# Patient Record
Sex: Male | Born: 1950 | Race: White | Hispanic: No | Marital: Married
Health system: Southern US, Community
[De-identification: ages and names within clinical notes are randomized; demographics above are authoritative.]

---

## 2000-10-08 ENCOUNTER — Inpatient Hospital Stay (HOSPITAL_COMMUNITY): Admission: EM | Admit: 2000-10-08 | Discharge: 2000-10-15 | Payer: Self-pay | Admitting: Emergency Medicine

## 2000-10-08 ENCOUNTER — Encounter: Payer: Self-pay | Admitting: Emergency Medicine

## 2000-10-09 ENCOUNTER — Encounter: Payer: Self-pay | Admitting: Neurology

## 2000-10-13 ENCOUNTER — Encounter: Payer: Self-pay | Admitting: *Deleted

## 2000-10-15 ENCOUNTER — Encounter: Payer: Self-pay | Admitting: Neurology

## 2000-10-17 ENCOUNTER — Encounter: Admission: RE | Admit: 2000-10-17 | Discharge: 2001-01-15 | Payer: Self-pay | Admitting: Neurology

## 2000-11-12 ENCOUNTER — Inpatient Hospital Stay (HOSPITAL_COMMUNITY): Admission: EM | Admit: 2000-11-12 | Discharge: 2000-11-14 | Payer: Self-pay | Admitting: Emergency Medicine

## 2000-11-12 ENCOUNTER — Encounter: Payer: Self-pay | Admitting: Neurology

## 2000-11-12 ENCOUNTER — Encounter: Payer: Self-pay | Admitting: Emergency Medicine

## 2000-11-13 ENCOUNTER — Encounter: Payer: Self-pay | Admitting: Neurology

## 2000-12-10 ENCOUNTER — Ambulatory Visit (HOSPITAL_COMMUNITY): Admission: RE | Admit: 2000-12-10 | Discharge: 2000-12-10 | Payer: Self-pay | Admitting: Internal Medicine

## 2000-12-10 ENCOUNTER — Encounter: Payer: Self-pay | Admitting: Internal Medicine

## 2000-12-18 ENCOUNTER — Encounter: Payer: Self-pay | Admitting: *Deleted

## 2000-12-18 ENCOUNTER — Inpatient Hospital Stay (HOSPITAL_COMMUNITY): Admission: AD | Admit: 2000-12-18 | Discharge: 2000-12-23 | Payer: Self-pay | Admitting: *Deleted

## 2000-12-22 ENCOUNTER — Encounter: Payer: Self-pay | Admitting: *Deleted

## 2000-12-26 ENCOUNTER — Encounter: Payer: Self-pay | Admitting: Neurology

## 2000-12-26 ENCOUNTER — Encounter: Admission: RE | Admit: 2000-12-26 | Discharge: 2000-12-26 | Payer: Self-pay | Admitting: Neurology

## 2001-01-01 ENCOUNTER — Encounter: Payer: Self-pay | Admitting: Gastroenterology

## 2001-01-01 ENCOUNTER — Encounter: Admission: RE | Admit: 2001-01-01 | Discharge: 2001-01-01 | Payer: Self-pay | Admitting: Gastroenterology

## 2001-01-10 ENCOUNTER — Encounter: Payer: Self-pay | Admitting: Emergency Medicine

## 2001-01-10 ENCOUNTER — Encounter (INDEPENDENT_AMBULATORY_CARE_PROVIDER_SITE_OTHER): Payer: Self-pay | Admitting: Specialist

## 2001-01-11 ENCOUNTER — Encounter: Payer: Self-pay | Admitting: Critical Care Medicine

## 2001-01-11 ENCOUNTER — Inpatient Hospital Stay (HOSPITAL_COMMUNITY): Admission: EM | Admit: 2001-01-11 | Discharge: 2001-01-16 | Payer: Self-pay | Admitting: Emergency Medicine

## 2001-01-14 ENCOUNTER — Encounter: Payer: Self-pay | Admitting: Internal Medicine

## 2001-01-15 ENCOUNTER — Encounter: Payer: Self-pay | Admitting: Internal Medicine

## 2001-01-21 ENCOUNTER — Encounter: Admission: RE | Admit: 2001-01-21 | Discharge: 2001-01-21 | Payer: Self-pay | Admitting: Family Medicine

## 2001-01-30 ENCOUNTER — Ambulatory Visit: Admission: RE | Admit: 2001-01-30 | Discharge: 2001-04-30 | Payer: Self-pay | Admitting: *Deleted

## 2001-02-06 ENCOUNTER — Encounter: Admission: RE | Admit: 2001-02-06 | Discharge: 2001-02-06 | Payer: Self-pay | Admitting: Family Medicine

## 2001-04-28 ENCOUNTER — Encounter: Admission: RE | Admit: 2001-04-28 | Discharge: 2001-04-28 | Payer: Self-pay | Admitting: Family Medicine

## 2001-05-06 ENCOUNTER — Encounter: Admission: RE | Admit: 2001-05-06 | Discharge: 2001-05-06 | Payer: Self-pay | Admitting: Family Medicine

## 2001-06-02 ENCOUNTER — Encounter: Admission: RE | Admit: 2001-06-02 | Discharge: 2001-06-02 | Payer: Self-pay | Admitting: Family Medicine

## 2001-06-11 ENCOUNTER — Encounter: Admission: RE | Admit: 2001-06-11 | Discharge: 2001-06-11 | Payer: Self-pay | Admitting: Family Medicine

## 2001-07-06 ENCOUNTER — Inpatient Hospital Stay (HOSPITAL_COMMUNITY): Admission: EM | Admit: 2001-07-06 | Discharge: 2001-07-07 | Payer: Self-pay | Admitting: Emergency Medicine

## 2001-07-06 ENCOUNTER — Encounter: Payer: Self-pay | Admitting: Family Medicine

## 2001-07-06 ENCOUNTER — Encounter: Admission: RE | Admit: 2001-07-06 | Discharge: 2001-07-06 | Payer: Self-pay | Admitting: Sports Medicine

## 2001-07-06 ENCOUNTER — Encounter: Payer: Self-pay | Admitting: Emergency Medicine

## 2001-07-07 ENCOUNTER — Encounter: Payer: Self-pay | Admitting: Family Medicine

## 2001-07-13 ENCOUNTER — Encounter: Admission: RE | Admit: 2001-07-13 | Discharge: 2001-07-13 | Payer: Self-pay | Admitting: Family Medicine

## 2001-07-20 ENCOUNTER — Encounter: Admission: RE | Admit: 2001-07-20 | Discharge: 2001-07-20 | Payer: Self-pay | Admitting: Sports Medicine

## 2001-09-14 ENCOUNTER — Encounter: Admission: RE | Admit: 2001-09-14 | Discharge: 2001-09-14 | Payer: Self-pay | Admitting: Family Medicine

## 2001-09-16 ENCOUNTER — Ambulatory Visit (HOSPITAL_COMMUNITY): Admission: RE | Admit: 2001-09-16 | Discharge: 2001-09-16 | Payer: Self-pay | Admitting: Gastroenterology

## 2001-09-25 ENCOUNTER — Encounter: Admission: RE | Admit: 2001-09-25 | Discharge: 2001-09-25 | Payer: Self-pay | Admitting: Family Medicine

## 2001-10-15 ENCOUNTER — Encounter: Admission: RE | Admit: 2001-10-15 | Discharge: 2001-10-15 | Payer: Self-pay | Admitting: Family Medicine

## 2001-10-21 ENCOUNTER — Encounter: Admission: RE | Admit: 2001-10-21 | Discharge: 2001-10-21 | Payer: Self-pay | Admitting: Family Medicine

## 2001-12-01 ENCOUNTER — Encounter: Admission: RE | Admit: 2001-12-01 | Discharge: 2001-12-01 | Payer: Self-pay | Admitting: Sports Medicine

## 2001-12-11 ENCOUNTER — Encounter: Admission: RE | Admit: 2001-12-11 | Discharge: 2001-12-11 | Payer: Self-pay | Admitting: Family Medicine

## 2001-12-25 ENCOUNTER — Ambulatory Visit (HOSPITAL_COMMUNITY): Admission: RE | Admit: 2001-12-25 | Discharge: 2001-12-25 | Payer: Self-pay | Admitting: Family Medicine

## 2001-12-25 ENCOUNTER — Encounter: Admission: RE | Admit: 2001-12-25 | Discharge: 2001-12-25 | Payer: Self-pay | Admitting: Family Medicine

## 2002-01-11 ENCOUNTER — Encounter: Payer: Self-pay | Admitting: Emergency Medicine

## 2002-01-11 ENCOUNTER — Inpatient Hospital Stay (HOSPITAL_COMMUNITY): Admission: EM | Admit: 2002-01-11 | Discharge: 2002-01-13 | Payer: Self-pay | Admitting: Emergency Medicine

## 2002-01-14 ENCOUNTER — Encounter: Admission: RE | Admit: 2002-01-14 | Discharge: 2002-01-14 | Payer: Self-pay | Admitting: Family Medicine

## 2002-01-15 ENCOUNTER — Encounter: Admission: RE | Admit: 2002-01-15 | Discharge: 2002-01-15 | Payer: Self-pay | Admitting: Family Medicine

## 2002-01-21 ENCOUNTER — Encounter: Admission: RE | Admit: 2002-01-21 | Discharge: 2002-01-21 | Payer: Self-pay | Admitting: Family Medicine

## 2002-01-22 ENCOUNTER — Ambulatory Visit (HOSPITAL_COMMUNITY): Admission: RE | Admit: 2002-01-22 | Discharge: 2002-01-22 | Payer: Self-pay | Admitting: *Deleted

## 2002-01-27 ENCOUNTER — Encounter: Admission: RE | Admit: 2002-01-27 | Discharge: 2002-01-27 | Payer: Self-pay | Admitting: Family Medicine

## 2002-01-28 ENCOUNTER — Encounter: Payer: Self-pay | Admitting: Family Medicine

## 2002-01-28 ENCOUNTER — Inpatient Hospital Stay (HOSPITAL_COMMUNITY): Admission: EM | Admit: 2002-01-28 | Discharge: 2002-02-01 | Payer: Self-pay | Admitting: Emergency Medicine

## 2002-02-03 ENCOUNTER — Encounter: Admission: RE | Admit: 2002-02-03 | Discharge: 2002-02-03 | Payer: Self-pay | Admitting: Family Medicine

## 2002-02-12 ENCOUNTER — Ambulatory Visit (HOSPITAL_COMMUNITY): Admission: RE | Admit: 2002-02-12 | Discharge: 2002-02-12 | Payer: Self-pay | Admitting: Family Medicine

## 2002-02-12 ENCOUNTER — Encounter: Admission: RE | Admit: 2002-02-12 | Discharge: 2002-02-12 | Payer: Self-pay | Admitting: Family Medicine

## 2002-02-17 ENCOUNTER — Encounter: Admission: RE | Admit: 2002-02-17 | Discharge: 2002-02-17 | Payer: Self-pay | Admitting: Family Medicine

## 2002-03-10 ENCOUNTER — Encounter: Admission: RE | Admit: 2002-03-10 | Discharge: 2002-03-10 | Payer: Self-pay | Admitting: Sports Medicine

## 2002-03-24 ENCOUNTER — Encounter: Admission: RE | Admit: 2002-03-24 | Discharge: 2002-03-24 | Payer: Self-pay | Admitting: Family Medicine

## 2002-04-08 ENCOUNTER — Encounter: Admission: RE | Admit: 2002-04-08 | Discharge: 2002-04-08 | Payer: Self-pay | Admitting: Family Medicine

## 2002-04-15 ENCOUNTER — Encounter: Admission: RE | Admit: 2002-04-15 | Discharge: 2002-04-15 | Payer: Self-pay | Admitting: Family Medicine

## 2002-04-18 ENCOUNTER — Encounter: Payer: Self-pay | Admitting: Emergency Medicine

## 2002-04-18 ENCOUNTER — Encounter: Payer: Self-pay | Admitting: Family Medicine

## 2002-04-18 ENCOUNTER — Inpatient Hospital Stay (HOSPITAL_COMMUNITY): Admission: EM | Admit: 2002-04-18 | Discharge: 2002-04-21 | Payer: Self-pay | Admitting: Emergency Medicine

## 2002-04-19 ENCOUNTER — Encounter: Payer: Self-pay | Admitting: General Surgery

## 2002-04-20 ENCOUNTER — Encounter: Payer: Self-pay | Admitting: General Surgery

## 2002-04-28 ENCOUNTER — Encounter: Admission: RE | Admit: 2002-04-28 | Discharge: 2002-04-28 | Payer: Self-pay | Admitting: Family Medicine

## 2002-05-06 ENCOUNTER — Encounter: Admission: RE | Admit: 2002-05-06 | Discharge: 2002-05-06 | Payer: Self-pay | Admitting: Family Medicine

## 2002-05-12 ENCOUNTER — Encounter: Admission: RE | Admit: 2002-05-12 | Discharge: 2002-05-12 | Payer: Self-pay | Admitting: Family Medicine

## 2002-05-18 ENCOUNTER — Encounter: Admission: RE | Admit: 2002-05-18 | Discharge: 2002-05-18 | Payer: Self-pay | Admitting: Sports Medicine

## 2002-05-27 ENCOUNTER — Encounter: Admission: RE | Admit: 2002-05-27 | Discharge: 2002-05-27 | Payer: Self-pay | Admitting: Family Medicine

## 2002-06-08 ENCOUNTER — Encounter: Admission: RE | Admit: 2002-06-08 | Discharge: 2002-06-08 | Payer: Self-pay | Admitting: Sports Medicine

## 2002-06-11 ENCOUNTER — Encounter: Admission: RE | Admit: 2002-06-11 | Discharge: 2002-06-11 | Payer: Self-pay | Admitting: Family Medicine

## 2002-06-11 ENCOUNTER — Encounter: Admission: RE | Admit: 2002-06-11 | Discharge: 2002-06-11 | Payer: Self-pay | Admitting: Sports Medicine

## 2002-06-11 ENCOUNTER — Encounter: Payer: Self-pay | Admitting: Sports Medicine

## 2002-06-18 ENCOUNTER — Encounter: Admission: RE | Admit: 2002-06-18 | Discharge: 2002-06-18 | Payer: Self-pay | Admitting: Family Medicine

## 2002-06-22 ENCOUNTER — Encounter: Admission: RE | Admit: 2002-06-22 | Discharge: 2002-06-22 | Payer: Self-pay | Admitting: Sports Medicine

## 2002-06-22 ENCOUNTER — Encounter: Admission: RE | Admit: 2002-06-22 | Discharge: 2002-06-22 | Payer: Self-pay | Admitting: Family Medicine

## 2002-06-22 ENCOUNTER — Encounter: Payer: Self-pay | Admitting: Sports Medicine

## 2002-06-25 ENCOUNTER — Encounter: Admission: RE | Admit: 2002-06-25 | Discharge: 2002-06-25 | Payer: Self-pay | Admitting: Family Medicine

## 2002-07-01 ENCOUNTER — Encounter: Admission: RE | Admit: 2002-07-01 | Discharge: 2002-07-01 | Payer: Self-pay | Admitting: Family Medicine

## 2002-07-27 ENCOUNTER — Encounter: Admission: RE | Admit: 2002-07-27 | Discharge: 2002-07-27 | Payer: Self-pay | Admitting: Family Medicine

## 2002-08-12 ENCOUNTER — Encounter: Admission: RE | Admit: 2002-08-12 | Discharge: 2002-08-12 | Payer: Self-pay | Admitting: Family Medicine

## 2002-09-09 ENCOUNTER — Encounter: Admission: RE | Admit: 2002-09-09 | Discharge: 2002-09-09 | Payer: Self-pay | Admitting: Family Medicine

## 2002-09-09 ENCOUNTER — Ambulatory Visit (HOSPITAL_COMMUNITY): Admission: RE | Admit: 2002-09-09 | Discharge: 2002-09-09 | Payer: Self-pay | Admitting: Family Medicine

## 2002-09-10 ENCOUNTER — Encounter: Payer: Self-pay | Admitting: Sports Medicine

## 2002-09-10 ENCOUNTER — Encounter: Admission: RE | Admit: 2002-09-10 | Discharge: 2002-09-10 | Payer: Self-pay | Admitting: Sports Medicine

## 2002-09-24 ENCOUNTER — Encounter: Admission: RE | Admit: 2002-09-24 | Discharge: 2002-09-24 | Payer: Self-pay | Admitting: Family Medicine

## 2002-10-15 ENCOUNTER — Encounter: Admission: RE | Admit: 2002-10-15 | Discharge: 2002-10-15 | Payer: Self-pay | Admitting: Family Medicine

## 2002-10-26 ENCOUNTER — Encounter: Admission: RE | Admit: 2002-10-26 | Discharge: 2002-10-26 | Payer: Self-pay | Admitting: Family Medicine

## 2002-11-18 ENCOUNTER — Encounter: Admission: RE | Admit: 2002-11-18 | Discharge: 2002-11-18 | Payer: Self-pay | Admitting: Sports Medicine

## 2002-11-18 ENCOUNTER — Encounter: Payer: Self-pay | Admitting: Sports Medicine

## 2002-12-07 ENCOUNTER — Encounter: Admission: RE | Admit: 2002-12-07 | Discharge: 2002-12-07 | Payer: Self-pay | Admitting: Family Medicine

## 2002-12-20 ENCOUNTER — Encounter: Admission: RE | Admit: 2002-12-20 | Discharge: 2002-12-20 | Payer: Self-pay | Admitting: Sports Medicine

## 2002-12-20 ENCOUNTER — Encounter: Payer: Self-pay | Admitting: Sports Medicine

## 2002-12-23 ENCOUNTER — Encounter: Admission: RE | Admit: 2002-12-23 | Discharge: 2002-12-23 | Payer: Self-pay | Admitting: Family Medicine

## 2003-01-27 ENCOUNTER — Encounter: Admission: RE | Admit: 2003-01-27 | Discharge: 2003-01-27 | Payer: Self-pay | Admitting: Family Medicine

## 2003-01-31 ENCOUNTER — Encounter: Admission: RE | Admit: 2003-01-31 | Discharge: 2003-01-31 | Payer: Self-pay | Admitting: Sports Medicine

## 2003-01-31 ENCOUNTER — Encounter: Payer: Self-pay | Admitting: Sports Medicine

## 2003-01-31 ENCOUNTER — Encounter: Admission: RE | Admit: 2003-01-31 | Discharge: 2003-01-31 | Payer: Self-pay | Admitting: Family Medicine

## 2003-02-16 ENCOUNTER — Encounter: Admission: RE | Admit: 2003-02-16 | Discharge: 2003-02-16 | Payer: Self-pay | Admitting: Family Medicine

## 2003-02-25 ENCOUNTER — Encounter: Admission: RE | Admit: 2003-02-25 | Discharge: 2003-02-25 | Payer: Self-pay | Admitting: Sports Medicine

## 2003-02-25 ENCOUNTER — Encounter: Payer: Self-pay | Admitting: Sports Medicine

## 2003-03-04 ENCOUNTER — Encounter: Admission: RE | Admit: 2003-03-04 | Discharge: 2003-03-04 | Payer: Self-pay | Admitting: Family Medicine

## 2003-03-29 ENCOUNTER — Encounter: Admission: RE | Admit: 2003-03-29 | Discharge: 2003-03-29 | Payer: Self-pay | Admitting: Sports Medicine

## 2003-04-08 ENCOUNTER — Encounter: Admission: RE | Admit: 2003-04-08 | Discharge: 2003-04-08 | Payer: Self-pay | Admitting: Sports Medicine

## 2003-04-08 ENCOUNTER — Encounter: Payer: Self-pay | Admitting: Sports Medicine

## 2003-04-08 ENCOUNTER — Encounter: Admission: RE | Admit: 2003-04-08 | Discharge: 2003-04-08 | Payer: Self-pay | Admitting: Family Medicine

## 2003-04-12 ENCOUNTER — Encounter: Payer: Self-pay | Admitting: Sports Medicine

## 2003-04-12 ENCOUNTER — Encounter: Admission: RE | Admit: 2003-04-12 | Discharge: 2003-04-12 | Payer: Self-pay | Admitting: Family Medicine

## 2003-04-12 ENCOUNTER — Encounter: Admission: RE | Admit: 2003-04-12 | Discharge: 2003-04-12 | Payer: Self-pay | Admitting: Sports Medicine

## 2003-04-14 ENCOUNTER — Encounter: Admission: RE | Admit: 2003-04-14 | Discharge: 2003-04-14 | Payer: Self-pay | Admitting: Family Medicine

## 2003-05-02 ENCOUNTER — Ambulatory Visit: Admission: RE | Admit: 2003-05-02 | Discharge: 2003-05-02 | Payer: Self-pay | Admitting: Internal Medicine

## 2003-05-04 ENCOUNTER — Encounter: Admission: RE | Admit: 2003-05-04 | Discharge: 2003-05-04 | Payer: Self-pay | Admitting: Family Medicine

## 2003-05-05 ENCOUNTER — Ambulatory Visit (HOSPITAL_COMMUNITY): Admission: RE | Admit: 2003-05-05 | Discharge: 2003-05-06 | Payer: Self-pay | Admitting: *Deleted

## 2003-05-08 ENCOUNTER — Emergency Department (HOSPITAL_COMMUNITY): Admission: EM | Admit: 2003-05-08 | Discharge: 2003-05-08 | Payer: Self-pay

## 2003-06-02 ENCOUNTER — Encounter: Admission: RE | Admit: 2003-06-02 | Discharge: 2003-06-02 | Payer: Self-pay | Admitting: Sports Medicine

## 2003-06-07 ENCOUNTER — Encounter: Payer: Self-pay | Admitting: *Deleted

## 2003-06-07 ENCOUNTER — Ambulatory Visit (HOSPITAL_COMMUNITY): Admission: RE | Admit: 2003-06-07 | Discharge: 2003-06-08 | Payer: Self-pay | Admitting: *Deleted

## 2003-06-09 ENCOUNTER — Inpatient Hospital Stay (HOSPITAL_COMMUNITY): Admission: AD | Admit: 2003-06-09 | Discharge: 2003-06-10 | Payer: Self-pay | Admitting: *Deleted

## 2003-06-10 ENCOUNTER — Encounter: Payer: Self-pay | Admitting: Cardiology

## 2003-07-01 ENCOUNTER — Encounter: Admission: RE | Admit: 2003-07-01 | Discharge: 2003-07-01 | Payer: Self-pay | Admitting: Family Medicine

## 2003-07-22 ENCOUNTER — Encounter: Admission: RE | Admit: 2003-07-22 | Discharge: 2003-07-22 | Payer: Self-pay | Admitting: Family Medicine

## 2003-08-12 ENCOUNTER — Encounter: Admission: RE | Admit: 2003-08-12 | Discharge: 2003-08-12 | Payer: Self-pay | Admitting: Family Medicine

## 2003-08-19 ENCOUNTER — Encounter: Admission: RE | Admit: 2003-08-19 | Discharge: 2003-08-19 | Payer: Self-pay | Admitting: Sports Medicine

## 2003-09-21 ENCOUNTER — Encounter: Admission: RE | Admit: 2003-09-21 | Discharge: 2003-09-21 | Payer: Self-pay | Admitting: Family Medicine

## 2003-10-21 ENCOUNTER — Encounter: Admission: RE | Admit: 2003-10-21 | Discharge: 2003-10-21 | Payer: Self-pay | Admitting: Family Medicine

## 2003-10-21 ENCOUNTER — Encounter: Admission: RE | Admit: 2003-10-21 | Discharge: 2003-10-21 | Payer: Self-pay | Admitting: Sports Medicine

## 2003-11-09 ENCOUNTER — Encounter: Admission: RE | Admit: 2003-11-09 | Discharge: 2003-11-09 | Payer: Self-pay | Admitting: Family Medicine

## 2003-11-17 ENCOUNTER — Encounter: Admission: RE | Admit: 2003-11-17 | Discharge: 2003-11-17 | Payer: Self-pay | Admitting: Family Medicine

## 2003-12-05 ENCOUNTER — Encounter: Admission: RE | Admit: 2003-12-05 | Discharge: 2003-12-05 | Payer: Self-pay | Admitting: Family Medicine

## 2004-01-04 ENCOUNTER — Encounter: Admission: RE | Admit: 2004-01-04 | Discharge: 2004-01-04 | Payer: Self-pay | Admitting: Family Medicine

## 2004-01-16 ENCOUNTER — Encounter: Admission: RE | Admit: 2004-01-16 | Discharge: 2004-01-16 | Payer: Self-pay | Admitting: Sports Medicine

## 2004-01-20 ENCOUNTER — Encounter: Admission: RE | Admit: 2004-01-20 | Discharge: 2004-01-20 | Payer: Self-pay | Admitting: Sports Medicine

## 2004-02-02 ENCOUNTER — Encounter: Admission: RE | Admit: 2004-02-02 | Discharge: 2004-02-02 | Payer: Self-pay | Admitting: Family Medicine

## 2004-02-02 ENCOUNTER — Encounter: Admission: RE | Admit: 2004-02-02 | Discharge: 2004-02-02 | Payer: Self-pay | Admitting: Sports Medicine

## 2004-02-15 ENCOUNTER — Encounter: Admission: RE | Admit: 2004-02-15 | Discharge: 2004-02-15 | Payer: Self-pay | Admitting: Sports Medicine

## 2004-02-27 ENCOUNTER — Encounter: Admission: RE | Admit: 2004-02-27 | Discharge: 2004-02-27 | Payer: Self-pay | Admitting: Sports Medicine

## 2004-03-29 ENCOUNTER — Encounter: Admission: RE | Admit: 2004-03-29 | Discharge: 2004-03-29 | Payer: Self-pay | Admitting: Family Medicine

## 2004-04-30 ENCOUNTER — Encounter: Admission: RE | Admit: 2004-04-30 | Discharge: 2004-04-30 | Payer: Self-pay | Admitting: Sports Medicine

## 2004-05-24 ENCOUNTER — Ambulatory Visit: Payer: Self-pay | Admitting: Family Medicine

## 2004-06-08 ENCOUNTER — Ambulatory Visit: Payer: Self-pay | Admitting: Sports Medicine

## 2004-07-06 ENCOUNTER — Ambulatory Visit: Payer: Self-pay | Admitting: Family Medicine

## 2004-08-08 ENCOUNTER — Ambulatory Visit: Payer: Self-pay | Admitting: Family Medicine

## 2005-01-25 ENCOUNTER — Ambulatory Visit: Payer: Self-pay | Admitting: Cardiology

## 2005-01-31 ENCOUNTER — Ambulatory Visit: Payer: Self-pay

## 2005-02-04 ENCOUNTER — Ambulatory Visit: Payer: Self-pay | Admitting: Internal Medicine

## 2005-02-06 ENCOUNTER — Ambulatory Visit: Payer: Self-pay | Admitting: Cardiology

## 2005-02-07 ENCOUNTER — Ambulatory Visit: Payer: Self-pay | Admitting: Cardiology

## 2005-02-07 ENCOUNTER — Ambulatory Visit (HOSPITAL_COMMUNITY): Admission: RE | Admit: 2005-02-07 | Discharge: 2005-02-07 | Payer: Self-pay | Admitting: Cardiology

## 2005-02-20 ENCOUNTER — Ambulatory Visit: Payer: Self-pay | Admitting: *Deleted

## 2005-03-18 ENCOUNTER — Ambulatory Visit: Payer: Self-pay | Admitting: Neurology

## 2005-03-25 ENCOUNTER — Ambulatory Visit: Payer: Self-pay | Admitting: Internal Medicine

## 2005-04-01 ENCOUNTER — Ambulatory Visit: Payer: Self-pay | Admitting: Internal Medicine

## 2005-11-21 ENCOUNTER — Ambulatory Visit: Payer: Self-pay | Admitting: Cardiology

## 2005-11-23 ENCOUNTER — Ambulatory Visit: Payer: Self-pay | Admitting: Physical Medicine & Rehabilitation

## 2005-11-23 ENCOUNTER — Ambulatory Visit: Payer: Self-pay | Admitting: Cardiovascular Disease

## 2005-11-23 ENCOUNTER — Inpatient Hospital Stay (HOSPITAL_COMMUNITY): Admission: EM | Admit: 2005-11-23 | Discharge: 2005-12-10 | Payer: Self-pay | Admitting: Emergency Medicine

## 2005-11-26 ENCOUNTER — Ambulatory Visit: Payer: Self-pay | Admitting: Emergency Medicine

## 2005-12-10 ENCOUNTER — Inpatient Hospital Stay (HOSPITAL_COMMUNITY)
Admission: RE | Admit: 2005-12-10 | Discharge: 2005-12-20 | Payer: Self-pay | Admitting: Physical Medicine & Rehabilitation

## 2005-12-10 ENCOUNTER — Ambulatory Visit: Payer: Self-pay | Admitting: Physical Medicine & Rehabilitation

## 2006-04-21 ENCOUNTER — Inpatient Hospital Stay (HOSPITAL_COMMUNITY): Admission: EM | Admit: 2006-04-21 | Discharge: 2006-04-25 | Payer: Self-pay | Admitting: Emergency Medicine

## 2006-04-22 ENCOUNTER — Ambulatory Visit: Payer: Self-pay | Admitting: Internal Medicine

## 2006-04-22 ENCOUNTER — Encounter: Payer: Self-pay | Admitting: Internal Medicine

## 2006-04-22 ENCOUNTER — Encounter (INDEPENDENT_AMBULATORY_CARE_PROVIDER_SITE_OTHER): Payer: Self-pay | Admitting: Neurology

## 2006-10-30 DIAGNOSIS — I6789 Other cerebrovascular disease: Secondary | ICD-10-CM | POA: Insufficient documentation

## 2006-10-30 DIAGNOSIS — I739 Peripheral vascular disease, unspecified: Secondary | ICD-10-CM

## 2006-10-30 DIAGNOSIS — J4489 Other specified chronic obstructive pulmonary disease: Secondary | ICD-10-CM | POA: Insufficient documentation

## 2006-10-30 DIAGNOSIS — F339 Major depressive disorder, recurrent, unspecified: Secondary | ICD-10-CM | POA: Insufficient documentation

## 2006-10-30 DIAGNOSIS — Z789 Other specified health status: Secondary | ICD-10-CM | POA: Insufficient documentation

## 2006-10-30 DIAGNOSIS — I209 Angina pectoris, unspecified: Secondary | ICD-10-CM

## 2006-10-30 DIAGNOSIS — I2510 Atherosclerotic heart disease of native coronary artery without angina pectoris: Secondary | ICD-10-CM | POA: Insufficient documentation

## 2006-10-30 DIAGNOSIS — J449 Chronic obstructive pulmonary disease, unspecified: Secondary | ICD-10-CM

## 2006-10-30 DIAGNOSIS — E785 Hyperlipidemia, unspecified: Secondary | ICD-10-CM

## 2006-10-30 DIAGNOSIS — J309 Allergic rhinitis, unspecified: Secondary | ICD-10-CM | POA: Insufficient documentation

## 2006-10-30 DIAGNOSIS — F411 Generalized anxiety disorder: Secondary | ICD-10-CM | POA: Insufficient documentation

## 2006-10-30 DIAGNOSIS — I10 Essential (primary) hypertension: Secondary | ICD-10-CM | POA: Insufficient documentation

## 2006-10-30 DIAGNOSIS — E039 Hypothyroidism, unspecified: Secondary | ICD-10-CM | POA: Insufficient documentation

## 2006-10-30 DIAGNOSIS — K449 Diaphragmatic hernia without obstruction or gangrene: Secondary | ICD-10-CM | POA: Insufficient documentation

## 2006-10-30 DIAGNOSIS — F17200 Nicotine dependence, unspecified, uncomplicated: Secondary | ICD-10-CM

## 2009-02-05 ENCOUNTER — Ambulatory Visit: Payer: Self-pay | Admitting: Family Medicine

## 2009-02-05 DIAGNOSIS — S8010XA Contusion of unspecified lower leg, initial encounter: Secondary | ICD-10-CM | POA: Insufficient documentation

## 2009-08-16 ENCOUNTER — Encounter (INDEPENDENT_AMBULATORY_CARE_PROVIDER_SITE_OTHER): Payer: Self-pay | Admitting: *Deleted

## 2010-04-18 ENCOUNTER — Telehealth: Payer: Self-pay | Admitting: Gastroenterology

## 2010-10-04 NOTE — Progress Notes (Signed)
Summary: Schedule Colonoscopy  Phone Note Outgoing Call Call back at Home Phone [phone]   Call placed by: Harlow Mares CMA Duncan Dull),  April 18, 2010 11:01 AM Call placed to: Patient Summary of Call: spoke to a male and she states that the number we have is not the patients number anymore, we will mail the patient another letter to remind him he is due for a colonoscopy Initial call taken by: Harlow Mares CMA Duncan Dull),  April 18, 2010 11:09 AM

## 2011-01-18 NOTE — H&P (Signed)
NAMESOCRATES, CAHOON              ACCOUNT NO.:  [id number]   MEDICAL RECORD NO.:  [id number]          PATIENT TYPE:  IPS   LOCATION:  4003                         FACILITY:  [facility]   PHYSICIAN:  Erick Colace, M.D.DATE OF BIRTH:  03/18/1951   DATE OF ADMISSION:  12/10/2005  DATE OF DISCHARGE:                                HISTORY & PHYSICAL   REASON FOR ADMISSION:  Gait disorder.   HISTORY:  This 59 year old male has a history of coronary artery disease,  hypertension, prior CVA with short-term memory deficits.  He was admitted on  November 23, 2005 for worsening of chest pain and shortness of breath.  He was  evaluated by [facility] and started on IV heparin.  Cardiac enzymes  were checked and were negative.  CT of the chest no PE, but severe  emphysema.  Dr. Orlin Hilding consulted for input on staring spells question TIA  versus seizure activity.  MRI and MRA done no acute changes, 50% left  vertebral stenosis with reconstitution, no significant changes.  The patient  had hemoptysis and sinusitis per MRI.  Pulmonary consult recommended  __________ for 14 days, taper bilateral carotid with cerebroangio done  occluded left vertebral with mild reconstitution plaque.  Proximal left  subclavian artery need to basilar artery secondary to plaque.  The patient  with organic gait disorder.  MRI of C-spine showed stenosis of C5-6,  herniated nucleus pulposus C6-7, mild flattening of the right side of cord.  Patient with left-sided numbness.  Neurontin dose is increased for  neuropathic pain.  An EEG on 03/21 is normal.  Review of systems is positive  for cough, sputum, dyspnea on exertion, chest pain although none current.  Reflux, numbness, anxiety, depression, and constipation.   PAST HISTORY:  1.  Coronary artery disease.  2.  Esophagitis.  3.  Hypertension.  4.  Depression,  5.  Hypothyroid.  6.  Conversion disorder.  7.  Right CVA.  8.  Emphysema.   FAMILY HISTORY:   Positive cancer.   SOCIAL HISTORY:  Married, lives in 1-level home, 2 steps to entry, disabled,  but independent prior to admission.  Wife works 3 times per week.  Tobacco:  1 pack per day.  No EtOH.   FUNCTIONAL HISTORY:  Been driving prior to admission.   ALLERGIES:  PENICILLIN and SULFA.   PHYSICAL EXAMINATION:  GENERAL:  In no acute distress.  VITAL SIGNS:  Normocephalic, atraumatic.  Eyes not injected.  ENT normal.  NECK:  Supple without adenopathy.  RESPIRATORY:  Good.  Lungs clear.  HEART:  Regular rate and rhythm.  ABDOMEN:  Positive bowel sounds.  Soft and nontender.  EXTREMITIES:  No clubbing, cyanosis, or edema.  NEUROLOGIC:  Sensation is reduced on the left side, but he is able to  distinguish light touch to fingers and toes, but takes a longer time to give  an answer on this than on the right side.  Orientation x3.  Memory:  Short-  term is reduced; mood and affect are appropriate.  Motor strength is 4/5 in  the left deltoid, biceps, triceps, finger flexor,  and hip flexor.  Tibialis  and gastrocnemius 5/5; 5 minus/5 on the right.   IMPRESSION:  1.  Multifocal gait disorder.  History of CVA, neuropathy plus/minus      cervical spinal stenosis.  2.  Hemoptysis, resolved after Avelox.  3.  Depression and anxiety on Trazodone, Wellbutrin, and Cymbalta. Resume      Klonopin.  4.  __________  5.  Coronary artery disease stable on Imdur.  6.  Cerebrovascular accident.  7.  History of mild hemiparesis.   ESTIMATED LENGTH OF STAY:  7-10 days.   Prognosis for functional improvement is fair.      Erick Colace, M.D.  Electronically Signed     AEK/MEDQ  D:  12/10/2005  T:  12/10/2005  Job:  [id number]   cc:   Dr. Carin Hock, M.D.  [address]  [address]  [place]  [place] [address]   Gustavus Messing. Orlin Hilding, M.D.  Fax: [phone]

## 2011-01-18 NOTE — Consult Note (Signed)
NAMEETHEL, VERONICA                          ACCOUNT NO.:  [id number]   MEDICAL RECORD NO.:  [id number]                   PATIENT TYPE:  INP   LOCATION:  5738                                 FACILITY:  [facility]   PHYSICIAN:  Marta Lamas. Gae Bon, M.D.            DATE OF BIRTH:  [birth year]   DATE OF CONSULTATION:  04/18/2002  DATE OF DISCHARGE:                           GENERAL SURGERY CONSULTATION   HISTORY:  Thank you very much for asking me to see this 60 year old  gentleman with significant chronic obstructive pulmonary disease status post  bullectomy at [facility] in August 2002.  He now comes in with a day of feculent  vomiting and severe abdominal pain.   I did not see the patient in the ER where he had most of his pain.  He  received, from what I could tell, 1 mg of Dilaudid in the ED and 2 mg of  morphine on the floor with significant relief of his pain along with  placement of an NG tube.  He has had a lot of feculent vomiting apparently.  Currently there are minimal amounts in his NG tube.  He has had no previous  abdominal surgery although he has multiple operations on his chest for his  lungs.   The patient apparently had a normal colonoscopy in January 2003.  Apparently  had a normal upper GI endoscopy also.  His current pain is of unknown  etiology but currently he started receiving minimal pain medicine.  He has  no peritonitis.  He says he has 1/10 pain with no rebound or guarding.  He  actually has pretty normal active bowel sounds.   Usually being notable for small bowel obstructions in an otherwise healthy  male secondary to __________  or mechanical process that requires  laparotomy, however, to do a laparotomy, a negative laparotomy in a  gentleman with his significant lung disease could have desire consequences.  Therefore, I will do other procedures to rule out mechanical obstruction  meaning likely a Gastrografin enema tomorrow followed by a small bowel  series or  small bowel followthrough the following 24-48 hours.  If there are  no blockages noted or if the patient clinically improves significantly then  he may not require laparotomy.  However, if he should worsen with his  discomfort this evening without getting much pain medication then we will  have to proceed to exploratory laparotomy tonight and his family understands  the risks and benefits of that.                                               Marta Lamas. Gae Bon, M.D.    JOW/MEDQ  D:  04/18/2002  T:  04/21/2002  Job:  [id number]   cc:   Leighton Roach McDiarmid, M.D.

## 2011-01-18 NOTE — H&P (Signed)
[facility]  Patient:    Carlos [name], Carlos [name]                     MRN: [id number] Adm. Date:  [id number] Attending:  Tobey Bride CC:         Lacretia Leigh. Quintella Reichert, M.D.   History and Physical  HISTORY OF PRESENT ILLNESS:  Nikos Anglemyer is a 60 year old right-handed white male, born 1951-06-29, with a history of tobacco abuse and probably an unrecognized or untreated hypertension.  This patient has felt bad for a couple of days, with onset of some left-sided numbness sensation and heaviness in the left leg that developed two days ago.  Patient notes that the numbness involves the left arm, leg and left face.  Patient has had some questionable slurred speech, has had headache in the vertex of the head, denies visual fields changes and has had problems with balance, staggery problems that worsened this morning.  This patient has had no loss of consciousness or confusion.  Patient has no prior history of stroke.  A CT scan of the head done through the emergency room shows bilateral old lacunar-type strokes.  No acute process is seen.  Neurology is asked to see the patient for further evaluation.  PAST MEDICAL HISTORY 1. New-onset of left hemisensory and hemiataxia -- probable stroke event. 2. History of tobacco abuse. 3. History of hypertension, not treated. 4. History of depression. 5. History of tonsillectomy in the past.  MEDICATIONS 1. Paxil 20 mg a day. 3. Restoril 20 mg q.h.s.  SOCIAL HISTORY:  Patient has been smoking a pack and a half of cigarettes daily.  Does not drink alcohol.  Patient lives in [place] area, is married, works transporting cars and has four children who are alive and well.  ALLERGIES:  Has an allergy to PENICILLIN.  FAMILY MEDICAL HISTORY:  Notable in that mother died at age 63 with renal failure and tobacco abuse; father died with a brain tumor and cancer.  He has one sister who is alive and well.  REVIEW OF  SYSTEMS:  Notable for no fever or chills.  Patient does have some neck pain.  Denies problems swallowing.  Does note some recent shortness of breath.  Has had some chest pressure sensations.  Does note occasional cough. Denies any problems controlling the bowels and bladder or dizziness episodes.  PHYSICAL EXAMINATION  VITAL SIGNS:  Blood pressure is 205/96.  Heart rate is 72.  Respiratory rate 20.  Temperature:  Afebrile.  GENERAL:  This patient is a fairly well-developed white male who is alert and cooperative at the time of examination.  HEENT:  Head is atraumatic.  Eyes:  Pupils are equal, round and reactive to light.  Disks are flat bilaterally.  NECK:  Supple.  No carotid bruits are noted.  RESPIRATORY:  Clear to auscultation and percussion.  CARDIOVASCULAR:  Regular rate and rhythm.  No obvious murmurs or rubs noted.  ABDOMEN:  Positive bowel sounds.  No organomegaly or tenderness noted.  EXTREMITIES:  Without significant edema.  NEUROLOGIC:  Cranial nerves as above.  Facial symmetry is present.  Patient notes good sensation to the face with pinprick and soft touch bilaterally. Patient has good strength of facial muscles and the muscles of head turning and shoulder shrugs bilaterally.  Patient has well-enunciated speech, not aphasic or dysarthric.  Again, visual fields are full.  Motor testing reveals trace weakness of the intrinsic muscles of the left hand;  otherwise, strength appears to be full throughout.  Patient has fairly good pinprick sensation in all fours.  Vibratory sensation is symmetric and normal in all fours.  Patient has fair finger-to-nose-to-finger in both upper extremities.  He has 1+ ataxia with toe-to-finger of the left leg as compared to the right.  Deep tendon reflexes are depressed but symmetric.  Toes are neutral bilaterally.  Patient was not ambulated.  IMAGING STUDY:  CT scan of the head does show old bilateral lacunar-type strokes in the left  parietal white matter and right putamen.  LABORATORY VALUES:  Notable for a CPK of 64, MB fraction 0.9, troponin I of less than 0.01; sodium of 142, potassium 4.6, chloride of 108, CO2 28, glucose of 98, BUN of 8, creatinine 1.1, calcium 9.6, total protein 7.8, albumin 4.0, AST of 13, ALT of 10, alkaline phosphatase of 70, bilirubin of 0.4; WBC of 8.6, hemoglobin of 15.8, hematocrit of 45.1, MCV of 82.8, platelets of 319,000.  IMPRESSION 1. New onset of right brain stroke with a left hemisensory deficit and    hemiataxia. 2. History of hypertension. 3. Tobacco abuse.  The patient clearly has some very minimal deficits on todays examination, but suspect the patient has had a recent stroke event.  Patient has had no prior stroke workup.  Will admit this patient briefly for workup.  PLAN 1. Admission to [facility]. 2. MRI scan of the brain. 3. MR angiogram. 4. Two-dimensional echocardiogram. 5. Carotid Dopplers studies. 6. IV fluids.  No heparin. 7. Aspirin therapy. 8. Physical therapy for gait. 9. Will follow clinical course while in house. DD:  10/08/00 TD:  10/09/00 Job: [id number]

## 2011-01-18 NOTE — Consult Note (Signed)
NAMEJAMARQUES, PINEDO              ACCOUNT NO.:  [id number]   MEDICAL RECORD NO.:  [id number]          PATIENT TYPE:  INP   LOCATION:  2038                         FACILITY:  [facility]   PHYSICIAN:  Gustavus Messing. Orlin Hilding, M.D.DATE OF BIRTH:  31-Oct-1950   DATE OF CONSULTATION:  11/25/2005  DATE OF DISCHARGE:                                   CONSULTATION   CHIEF COMPLAINT:  Staring spell. Rule out TIA.   HISTORY OF PRESENT ILLNESS:  Mr. Tones is a 60 year old right-handed  married white man with a history of hypertension, coronary artery disease,  status post stent placement remotely, CHF, lung cancer, status post lung  resection on the right in 2002 with history of two previous major strokes  between 2000 and 2003 with right brain affecting the left side of the body.  He was admitted on November 23, 2005, for chest pain and shortness of breath.  He was supposed to have a catheterization today.  However, yesterday while  family members were visiting he was noted to have an episode of staring,  looked like he was staring at the floor and out the door to his left. He was  unresponsive to verbal interaction for five to ten minutes. The family said  there might have been some very mild left facial drooping following this  episode for about an hour. He was confused and had slurred speech. He did  not really recall anything about it and no other focal abnormalities were  described. He could not recall the conversation they had 30 minutes prior to  the event, through the event, or afterwards, but he recalled something that  he was watching on television just before that. There was no convulsive  activity. No tongue biting. No incontinence. He has had some very minimal,  residual left-sided symptoms from his previous strokes and he has been  ataxic since the previous strokes, but it has been worsening over the last  month, according to the patient and his wife. He is now back to his  baseline.   REVIEW OF SYSTEMS:  Out of a 12-system review, including a cardiovascular,  pulmonary, neurologic, hematologic, endocrine, GI, GU, musculoskeletal, ENT,  reproductive, skin/mucosa, pain, sleep, and nutrition, he complains of the  following: Shortness of breath at rest and with exertion, cough, headaches  especially on the left side, falls as he does not frequently (he is not  using any assistive devices), and short-term memory problems. Some  hemorrhoid bleeding, but that is remote. Some vision problems. He had some  transient visual obscuration or distortion while in the emergency room two  days ago, but he did not have a complete blackout of vision. He is  edentulous and uses both upper and lower dentures. He does have some  bruising. He had some problems with insomnia.   PAST MEDICAL HISTORY:  1.  Hypertension.  2.  CHF.  3.  Coronary artery disease, status post stenting.  4.  Hypothyroidism.  5.  COPD.  6.  Lung cancer, status post right lung resection.   MEDICATIONS:  Levoxyl, trazodone, Spiriva, Cymbalta, Klonopin, Wellbutrin,  Plavix, Imdur, Protonix, Zocor, and Lasix.   ALLERGIES:  PENICILLIN and SULFA.   SOCIAL HISTORY:  He is married and lives with his wife and granddaughter. He  does smoke a pack of cigarettes a day. No alcohol use. He formerly worked as  a Curator.   FAMILY HISTORY:  Positive for kidney disease, brain cancer, and depression.   OBJECTIVE:  VITAL SIGNS: On exam, vital signs reveal temperature of 97,  heart rate 71, BP 114/62, respirations 18, 95% saturation on room air.  HEENT: Normocephalic and atraumatic.  NECK: Supple without bruits.  HEART: Regular rate and rhythm.  NEUROLOGIC: He is awake, alert, and appropriate with normal language. No  evidence of an aphasia. He is oriented. Cranial nerves--pupils are equal and  reactive. Visual fields are full to confrontation. Extraocular movements are  intact. Facial sensation is intact. Facial motor  activity is normal. Hearing  is intact. Palate is symmetric. Tongue is midline. There is normal shoulder  shrug. On motor exam he has no drift in either upper or lower extremities.  He has 5/5 strength with normal bulk, tone, and strength in all four  extremities with the exception of perhaps 5-/5 psoas on the left. His gait  is very unsteady, however he cannot walk without assistance or holding onto  something. I cannot really check tandem gait because of this. There is no  drift. No satelliting. Normal rapid fine movements, although actually  perhaps slightly decreased on the right compared to the left. No  fasciculations, atrophy, or tremor. Deep tendon reflexes are 2+ triceps, 2+  right biceps, 1+ left biceps, absent brachial radialis bilaterally, 2+ knee  jerks bilaterally, 2+ right ankle, 1+ left ankle, downgoing toes.  Coordination, finger-to-nose, rapid alternating movements, and heel-to-shin  are normal without dysmetria. He was unable to do tandem gait because of the  unsteadiness of his straight away gait. Sensory exam is remarkable for some  decreased sensation to pinprick on the left side of the body. He has no  imaging.   IMPRESSION:  1.  Staring spell more suspicious for complex partial seizures and transient      ischemic attack.  2.  Progressive ataxia of uncertain etiology. Could be a recent stroke.  3.  Perineoplastic syndrome.  4.  B12 deficiency.  He does not have any myeloplastic findings to suggest      an upper cervical process, but that is also a possibility.   RECOMMENDATIONS:  MRI of the brain; MR angiogram of the head and neck; an  EEG. Will also get perineoplastic antibody panel and vitamin B12 for now.      Catherine A. Orlin Hilding, M.D.  Electronically Signed     CAW/MEDQ  D:  11/25/2005  T:  11/26/2005  Job:  [id number]

## 2011-01-18 NOTE — Discharge Summary (Signed)
[name]  Patient:    Carlos [name]                     MRN: [id number] Adm. Date:  [id number] Disc. Date: 12/23/00 Attending:  Enos Fling CC:         Anselmo Rod, M.D.  Clinton D. Maple Hudson, M.D.  [facility], [address], [place], [place]   Discharge Summary  ADMISSION DIAGNOSES: 1. History of cerebrovascular disease with new left-sided numbness, rule out    stroke. 2. History of emphysema. 3. Mild coronary artery disease. 4. Hypertension.  DISCHARGE DIAGNOSES: 1. Cerebrovascular disease, stable. 2. Emphysema. 3. Mild coronary artery disease. 4. Hypertension. 5. Hiatal hernia. 6. Depression/anxiety. 7. Back and lower abdominal pain and discomfort.  PROCEDURES: 1. Magnetic resonance imaging of the brain. 2. Electroencephalogram x 2. 3. Barium swallow study.  COMPLICATIONS:  None.  HISTORY OF PRESENT ILLNESS:  Carlos [name] is a 60 year old, right-handed white male born on 1951-08-06, with a history of coronary artery disease, hypertension, emphysema with right thalamic stroke within the last couple of months with left hemisensory deficit.  The patient has been seen by Dr. Fannie Knee for his emphysema and presents at this point with complaints of problems with feeling generally worse over the several days prior to admission, visual disturbance imbalance, chest pain and left-sided numbness. The patient has recently been treated for a pneumonia and was on Levaquin this admission.  The patient has been complaining of some swallowing and choking problems, lower abdominal discomfort and back pain.  The patient is brought into the hospital for evaluation of cerebrovascular disease and multiple other somatic complaints.  Prior to admission, the patient has also been complaining of depression with delusional thinking believing that his daughter is trying to poison him.  PAST MEDICAL  HISTORY: 1. History of thalamic stroke. 2. Emphysema. 3. Mild coronary artery disease. 4. History of hypertension. 5. History of peptic ulcer disease. 6. History of tobacco use. 7. History of depression. 8. History of anxiety. 9. Hiatal hernia.  MEDICATIONS: 1. Tiazac 300 mg a day. 2. Atacand 32 mg daily. 3. Trazodone 200 mg q.h.s. 4. Plavix 75 mg daily. 5. Enteric coated aspirin 325 mg a day. 6. Diazepam 10 mg q.h.s. 7. Albuterol and Atrovent inhaler.  ALLERGY:  PENICILLIN.  SOCIAL HISTORY:  He has recently quit smoking.  He is not much of a drinker.  Please refer to the History and Physical dictation summary for social history, family history, review of systems and physical examination.  LABORATORY DATA AND X-RAY FINDINGS:  Notable for white count 11.4 on admission, hemoglobin 13.8, hematocrit 40.1, MCV 85.6, platelets 344. Hemoccult blood negative.  Protime 12.9, INR 1.0.  Sodium 136, potassium 4.6, chloride 100, CO2 20, glucose 90, BUN 21, creatinine 1.5, calcium 9.8.  Total protein 7.4, albumin 3.4, AST 17, ALT 21, Alk phos 71, total bilirubin 0.7. Cholesterol 239, HDL 36, LDL 166.  Specific gravity of 1.023, pH 5.5.  A chest x-ray reveals chronic changes with apical emphysema with no active disease seen.  MRI scan of the brain was performed.  EKG shows normal sinus rhythm and left axis deviation with heart rate of 63.  HOSPITAL COURSE:  This patient has done relatively well during the course of hospitalization.  The patient has had multitude of somatic complaints including problems with low back pain and lower abdominal discomfort.  The patient also complains of itching and skin rash.  He notes daily headaches over the last two weeks of bitemporal nature.  The patient had complained of left facial numbness on admission and gait instability.  He has had complaints of dysphagia, chest pains, starring episodes that have occurred, hemorrhoidal/rectal bleeding and has  had delusional thinking as previously stated.  He has had recent pneumonia as well.  The patient was admitted for evaluation of multiple complaints.  The patient has undergone an MRI scan of the brain that shows no acute changes, chronic changes noted.  A barium swallow was performed and was normal.  Evidence of hiatal hernia was seen. The patient was also seen by psychiatry and Dr. Jeanie Sewer who did not recommend any change in psychiatric medical treatment, but the patient may require a psychologist as an outpatient.  The patient was to be seen by Dr. Loreta Ave as an outpatient.  Dr. Loreta Ave was called and the patient will have followup following this discharge.  The patient was continued on a full course of Levaquin during this hospitalization.  At this time, the patient is fully alert, cooperative, fully ambulatory, visual fields full and speech normal. The patient will be discharged to home.  DISCHARGE MEDICATIONS: 1. Plavix 75 mg one a day. 2. Aspirin 325 mg one a day. 3. Celexa 40 mg one a day. 4. Valium 10 mg one at night. 5. Desyrel 100 mg one at night. 6. Albuterol inhaler every six hours as needed. 7. Atrovent nebulizer every six hours if needed. 8. Tiazac 300 mg one a day. 9. Atacand 32 mg daily.  ACTIVITY:  No strenuous activity.  DIET:  No added salt diet.  FOLLOWUP:  Follow up with Dr. Loreta Ave as an outpatient.  Follow up with Dr. Shari Heritage in three weeks.  The patient had a lumbosacral x-ray ordered during this admission, but could not be performed due to recent barium study. This is to be repeated as an outpatient.  It is felt that the patient does have significant problems with underlying depression and anxiety which may be leading to some of the somatic complaints as listed above. DD:  12/23/00 TD:  12/24/00 Job: [id number]

## 2011-01-18 NOTE — H&P (Signed)
[place]. [facility]  Patient:    Carlos [name], Carlos [name] Visit Number: [id number] MRN: [id number]          Service Type: MED Location: [phone] Attending Physician:  Tobin Chad Dictated by:   Pricilla Holm, M.D. Admit Date:  01/11/2002                           History and Physical  SERVICE:  Conservation officer, historic buildings.  PRIMARY PHYSICIAN:  Dr. Onalee Hua L. Priebe.  CHIEF COMPLAINT:  Feeling unsteady and chest pain.  HISTORY OF PRESENT ILLNESS:  The patient is a 60 year old white male with very complicated past medical history, who presents to the emergency room feeling unsteady and chest pain.  Apparently, this has been an ongoing process over the past couple of months, however, the patient and family endorse worsening over the past 24 hours.  He stated that yesterday while he was walking, he started feeling very unsteady and started to have an increase in his chest pain.  He denies any shortness of breath or diaphoresis with chest pain; no radiation of the chest pain, but he does, however, report some right arm pain. He described the pain as a "heaviness."  During that same day, he tried to walk down the stairs and turn and the patient reported that "his feet would not move."  The patient fell over against the wall and hit his arm.  He did not hit his head, denies any head trauma or loss of consciousness.  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  PAST MEDICAL HISTORY:  1. Hypothyroidism.  2. Hyperlipidemia.  3. Depression.  4. Anxiety.  5. Tobacco abuse.  6. Hypertension.  7. Coronary artery disease.  8. 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  9. Intermittent claudication. 10. History of DVT. 11. COPD. 12. History of gastric ulcer with hemorrhage. 13. Hiatal hernia. 14. Low back pain.  SURGICAL PROCEDURES:  The patient is status post embolectomy at  [facility].  MEDICATIONS:  1. Advair Diskus 250/50 b.i.d.  2. Albuterol p.r.n.  3. Aricept 10 mg q.h.s.  4. Aspirin 81 mg q.d.  5. Atacand 32/12.5 mg, currently on hold.  6. Atrovent MDI p.r.n.  7. Levoxyl 25 mcg q.d.  8. Lexapro 20 mg q.d.  9. Pepcid 20 mg p.o. b.i.d. 10. Plavix 75 mg q.d. 11. Trazodone 150 mg q.h.s. 12. Valium 10 mg a half tab p.r.n. 13. Vitamin B6 q.d. 14. Wellbutrin 150 mg b.i.d. 15. Zocor 40 mg q.d.  ALLERGIES:  PENICILLIN and SULFA.  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  He is currently married; this is his fourth marriage.  Greater than 80-pack-year history of tobacco, highest 4 packs per day, now down to 1 pack per day.  FAMILY HISTORY:  Father died of brain cancer.  Mother died of "old age."  REVIEW OF SYSTEMS:  The patient denies any fevers, chills, nausea and vomiting.  CARDIOVASCULAR:  Per HPI.  RESPIRATORY:  The patient has COPD. Denies any shortness of breath but occasionally has wheezing.  GI:  No constipation and, in fact, he endorses loose bowel movements now.  Occasional dysphagia.  SKIN:  No rashes.  NEUROLOGIC:  Left facial numbness. PSYCHIATRIC:  Positive for anxiety and depression.  MUSCULOSKELETAL:  Residual left hemiparesis.  EYES:  Left with resolved blurry vision.  ENDOCRINE:  The patient has hypothyroidism.  ENT:  Poor dentition.  No abscess.  GU:  The patient denies dysuria.  HEMATOLOGY:  The patient reports bruising and bleeding very easily.  PHYSICAL EXAMINATION:  VITAL SIGNS:  Temperature 96.7, pulse 53, respirations 16, blood pressure 126/67, 95% on room air.  GENERAL:  This is a well-nourished, well-developed, very pleasant white male in no acute distress.  HEENT:  Normocephalic, atraumatic.  Pupils equal, round and reactive to light and accommodation.  There is horizontal nystagmus present.  Nares patent.  TMs clear.  Oropharynx with poor dentition.  No evidence of abscess, lesions  or ulcerations.  NECK:  Supple.  No lymphadenopathy.  HEART:  Very distant heart sounds.  No murmur appreciated.  Bradycardic.  LUNGS:  Prolonged expiratory phase.  Positive diffuse wheezing.  Good respiratory effort.  ABDOMEN:  Soft, nontender, nondistended.  Positive bowel sounds.  No hepatosplenomegaly.  EXTREMITIES:  No clubbing, cyanosis, or edema.  GENITOURINARY:  Normal male.  RECTAL:  Normal rectal tone.  Nontender prostate.  Heme-negative.  NEUROLOGIC:  Mental status:  Alert and oriented x4.  Cranial nerves II-XII grossly intact except for left facial droop.  Motor:  Left facial droop. Decreased upper and lower extremity strength, approximately 4/5; right upper and lower 5/5.  Sensory:  Decreased sensation in left side of the face. Coordination:  Able to perform finger-to-nose without problems, able to perform rapid hand movements without problems.  No pronator drift present. Gait slow and purposeful.  Reflexes:  Left-sided hyperreflexia.  LABORATORY AND ACCESSORY DATA:  EKG:  Forty-three beats per minute, sinus bradycardia, poor R wave progression, questionable U waves, nonspecific ST waves.  Head CT negative for acute.  Sodium 142, potassium 3.3, chloride 103, bicarb 27, BUN 12, creatinine 1.3, glucose 93, calcium 8.6.  White blood cell count 8.6, hemoglobin 13.7, hematocrit 39.9, platelets 211,000; ANC 5.0, MCV 82.9.  CK 47, CK-MB 0.6, troponin 0.01.  PT 14, INR 1.1, PTT 37.  ASSESSMENT AND PLAN:  [age]-year-old white male who appears to be vasculopathic with right cerebrovascular accident, now with chest pain and ataxia.   1. Admit to [facility], Dr. Deniece Portela A. Sheffield Slider, attending.  2. Chest pain:  Patient with multiple risk factors including hypertension,     hypercholesterolemia, male, age, tobacco abuse, positive family history,     previous catheterization with diffuse severe disease in 2002.  Will rule     this patient out for myocardial  infarction.  Dr. Peter M. [place] is his      cardiologist.  We will check cardiac enzymes, thyroid-stimulating hormone     and fasting lipid panel; continue him on his aspirin; suspect his pulse     will not be able to tolerate any cardiac medications at this time.     Additional differential includes anxiety, chronic obstructive pulmonary     disease, pulmonary embolus, reflux.  3. Bradycardia:  Differential includes myxedema (hypothyroidism), medication     induced or cardiac etiology such as sick sinus syndrome.  Will discuss     with team, need for cardiology consult.  Perform orthostatics.  Pharmacy     to evaluate medication list.  Continue the patient on telemetry.  4. Chronic obstructive pulmonary disease:  Continue Advair, albuterol q.6h.     with q.2h. p.r.n.  5. Cerebrovascular accident:  The patient has a history of cerebrovascular     accident without new findings on physical exam.  He does, however, have  ataxia.  Differential includes medication induced versus bradycardia     versus new cerebellar process, head CT negative for acute process.  Will     consider neurological consult and MRI.  6. Anxiety:  Continue trazodone, Wellbutrin and Lexapro.  Discuss with team     need to discontinue any of these concerning for over-medicating.  7. Gastrointestinal:  Continue Pepcid -- heme-negative -- and patient is on     aspirin.  8. Fluids, electrolytes, and nutrition:  Replete potassium.  Put the patient     on a cardiac-prudent diet. Dictated by:   Pricilla Holm, M.D. Attending Physician:  Tobin Chad DD:  01/11/02 TD:  01/12/02 Job: [id number]

## 2011-01-18 NOTE — Discharge Summary (Signed)
   NAME:  Carlos [name], Carlos [name]                        ACCOUNT NO.:  [id number]   MEDICAL RECORD NO.:  [id number]                   PATIENT TYPE:  INP   LOCATION:  2019                                 FACILITY:  [facility]   PHYSICIAN:  Veneda Melter, M.D.                   DATE OF BIRTH:  12-08-1950   DATE OF ADMISSION:  06/09/2003  DATE OF DISCHARGE:  06/10/2003                           DISCHARGE SUMMARY - REFERRING   DISCHARGE DIAGNOSES:  1. Peripheral vascular disease,  status post stent to the right iliac with     scrotal edema.  2. Scrotal hematoma secondary to tracking of blood.  3. Hyperlipidemia.  4. Hypertension.  5. Cerebrovascular accident.  6. Hypothyroidism, treated.  7. Gastroesophageal reflux disease.  8. Known coronary artery disease.   HOSPITAL COURSE:  [name] is a 60 year old male patient of Dr. Chales Abrahams, who  underwent a right common iliac stent placement on June 07, 2003, and was  discharged on June 08, 2003.  He complained of onset of left leg swelling  right at discharge yesterday and last evening after admission began noticing  increasing swelling along the pelvis and scrotal area.  He has not had any  problems with urination.  He states that the bleeding has actually receded  slightly.  We did follow serial CBCs while he was in the hospital and did  obtain an abdomen/pelvis CT, although unfortunately this is not on the chart  at the time of the dictation.   By the following morning, the patient's pain had decreased.  Hemoglobin  11.8, hematocrit 34.8, and potassium 3.6.  He was discharged to home in  stable condition on his current home medications.  From recollection, the CT  revealed a right groin hematoma tracking down the ligament into the scrotum  with no active bleed.   He may utilize Tylenol 1-2 tablets q.6 h. as needed for pain.  No strenuous  activity or lifting over 10 pounds for a week, and remain on a low-fat diet.  Keflex with plenty of water.   He has an appointment with the PA on June 17, 2003, at 4:30 p.m. for a leg check.  He has an appointment with Dr.  Chales Abrahams on November 1, at 11:45.      Cathlyn Parsons,, P.A.-C  [facility]              Veneda Melter, M.D.    LDB/MEDQ  D:  06/23/2003  T:  06/23/2003  Job:  [id number]

## 2011-01-18 NOTE — Consult Note (Signed)
[place]. [facility]  Patient:    Carlos [name], Carlos [name]                     MRN: [id number] Proc. Date: 11/14/00 Adm. Date:  [id number] Attending:  Erich [name] [name]:         Carlos [name], M.D.  Carlos [name], M.D.   Consultation Report  HISTORY OF PRESENT ILLNESS:  The patient is a 60 year old white male smoker with severe emphysema seen at the request of the neurology service and the patients wife because of dyspnea. I had recently met him for office evaluation. He has been a heavy smoker and continues to smoke regularly. He had recently been evaluated for a hypertensive cerebrovascular accident; and during that evaluation, chest x-ray and subsequent CT scan had shown severe bullous emphysema with crowding of markings. There was some question of focal density felt most likely to represent scarring but needing longer term follow-up. Pulmonary function testing in the office had demonstrated a forced vital capacity of 2800 [name] (53% of predicted normal), a forced expiratory volume in 1 second of 1600 [name] (38% of predicted). No significant change after bronchodilator and a resting room air oxygen saturation of 96%. He had been provided with an Advair inhaler which he says makes little definite difference. He was admitted for the current hospitalization complaining of slurred speech and left-sided numbness. The clinicians evaluating him had felt that there may have been a component of hyperventilation at the time of admission. At baseline, he is dyspneic with activities of daily living and keeps a dry cough. He is uncomfortable particularly in the mornings on waking and in warm and humid weather. He has had occasional distinctly chest-wall related sharp twinges in the anterior chest associated with twisting and forced deep breath. Scant sputum has been only white with no blood.  REVIEW OF SYSTEMS:  In addition to above, he has noted some weight loss  since his initial cerebrovascular accident. He has not had palpitations, fever, or chills but has complained of mildly unsteady dizziness or vertigo. His right leg swells a little occasionally when dependent, without pain. He does experience claudication.  PAST MEDICAL HISTORY:  Cerebrovascular accident, chronic obstructive pulmonary disease. Cardiac catheterization showing nondominant right coronary artery partially occluded, 50 to 60% lesion in the second diagonal and distal disease in the apical recurrent branch with mild hypokinesis. Depression, hypertension. Remote history of tuberculosis exposure with subsequent PPDs always negative. Deep vein thrombosis right leg after trauma with no recurrence and no history of pulmonary embolism.  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  FAMILY HISTORY:  Father died of brain cancer. Mother died of old age.  ALLERGIES:  PENICILLIN.  PHYSICAL EXAMINATION:  VITAL SIGNS:  Temperature 98.3, pulse regular 76, blood pressure 130/60, respirations unlabored about 18 per minute, room air saturation 91 to 92% at rest.  SKIN:  Clear.  ADENOPATHY:  None.  HEENT:  Speech clear. Oral mucosa normal.  NECK:  No JVD or stridor.  CHEST:  Distant breath sounds and slow expiratory phase bilaterally without wheeze, rales, rhonchi, or cough. He is speaking in sentences of normal length without use of accessory muscles at rest.  HEART:  Regular rhythm. Normal S1, S2. P2 is not increased. PMI is about 2 cm left of the left sternal border without heave.  ABDOMEN:  No hepatosplenomegaly.  EXTREMITIES:  No clubbing, cyanosis, edema, cords, or calf tenderness evident now.  LABORATORY DATA:  Hemoglobin 13.7, white blood count marginally elevated at 11,100, with 67 neutrophils. Admission ABG:  pH 7.39, pCO2 46, pO2 62, bicarbonate 26, oxygen saturation 92 on room air. Cardiac enzymes not elevated.  Chest x-ray  showed severe bullous emphysema with lower lobe atelectasis "with potential for lower lobe infiltrates in the appropriate clinical setting" reflecting crowding of markings from his bullous emphysema.  EKGs showed sinus rhythm; septal infarct, age undetermined.  IMPRESSION:  Severe chronic obstructive pulmonary disease mostly emphysema with large bullae. At some point, he may be considered a candidate for bullectomy, although surgical benefit from this approach is highly variable. There is little bronchodilator potential. It is reasonable to continue Advair for now, and I am suggesting addition of guaifenesin to relieve some of his sense of mucus congestion. His pO2 on room air at rest is not quite in the range usually used for indicating continuous home oxygen. This can be re-evaluated in the context of exercise and sleep over time. There had been some question about a focal area of scarring on chest x-ray/CT scan which almost certainly by CT scan was simply crowded markings, but this could be watched on office follow-up which is already scheduled with my office. Complicating problems of his diffuse peripheral vascular disease include recognition of anxiety and somewhat limited insight. Differential diagnostic issues of concern for this gentleman with complaint of dyspnea will include some ongoing vigilance against the possibility of deep vein thrombosis and pulmonary embolism, cardiac ischemia, and infarction. These issues also need to be watched by Carlos [name], M.D., his primary physician. Carlos [name] will benefit from pulmonary rehabilitation, and he and his wife know of the availability of this program if he chooses to participate. His depression and anxiety need to be addressed over time, and he needs counseling related to disability status and vocational rehabilitation. I have talked with him in detail about smoking cessation in the office and again today. He understands  that  support measures are available when he chooses to utilize them.  PLAN:  Guaifenesin 1200 mg b.i.d. for 7 day trial, continued Advair 100/50 one inhalation b.i.d., pulmonary rehabilitation program. Follow up with his primary physician and ongoing counseling for smoking cessation, depression, disability guidance, and vocational rehabilitation. Pharmaceutical consideration includes possibility of a trial of theophylline if this is tolerated by his other medical problems. He may benefit from a portable oxygen tank at home on a self-pay basis. I have reminded them that we had set him up to return to see me in the office in 6 to 8 weeks for follow-up. DD:  11/14/00 TD:  11/14/00 Job: [id number]

## 2011-01-18 NOTE — Discharge Summary (Signed)
[place]. [facility]  Patient:    Carlos [name], Carlos [name] Visit Number: [id number] MRN: [id number]          Service Type: MED Location: [phone] Attending Physician:  Tobin Chad Dictated by:   Lucille Passy, M.D. Admit Date:  01/11/2002 Discharge Date: 01/13/2002   CC:         Carlos [name], M.D.  Meade Maw, M.D.  Genene Churn. Love, M.D.   Discharge Summary  DATE OF BIRTH:  Jan 10, 1951  DISCHARGE DIAGNOSES:  1. Chest pain.  2. Bradycardia.  3. Transient ischemic attack, status post cerebrovascular accident.  4. Chronic obstructive pulmonary disease.  5. Depression and anxiety.  DISCHARGE MEDICATIONS:  1. Advair Diskus 250/50 b.i.d.  2. Albuterol as needed.  3. Aricept 10 mg p.o. q.h.s.  4. Aspirin 81 mg p.o. q.d.  5. Atacand 32/12.5 mg p.o. q.d. (restarted).  6. Atrovent MDI as needed.  7. Levoxyl 25 mcg p.o. q.d.  8. Lexapro 20 mg p.o. q.d.  9. Pepcid 20 mg p.o. b.i.d. 10. Plavix 75 mg p.o. q.d. 11. Trazodone 150 mg p.o. q.h.s. 12. Valium 5 mg p.o. p.r.n. anxiety. 13. Wellbutrin 150 mg p.o. b.i.d. 14. Zocor 40 mg p.o. q.d. 15. Imdur 30 mg p.o. q.d. (new medicine).  DISCHARGE INSTRUCTIONS:  Activity as tolerated, low-salt diet.  The patient was given an appointment with Dr. Reed [name] prior to discharge for Jan 14, 2002. The patient was told to make an appointment with an outpatient neurologist, either with Dr. Sandria Manly or with a neurologist [place] the [place]  The patient is to follow up with Dr. Fraser Din in six weeks.  The patient was informed that Dr. Faythe Ghee office will call him to set up an event monitor on an outpatient basis.  The patient was referred to [facility] by the case manager.  HISTORY:  Carlos [name] is a 60 year old Caucasian male with a very complicated past medical history including prior CVA who presented to the emergency room on the  day of admission with ataxia, chest pain, and was found to have bradycardia.  ADMISSION LABORATORY DATA:  Admission EKG showed sinus bradycardia with a rate of 43 beats per minute, poor R wave progression, questionable U waves, nonspecific ST waves.  Admission head CT was negative.  Sodium 142, potassium 3.3, chloride 103, bicarb 27, BUN 12, creatinine 1.3, glucose 93, calcium 8.6.  White blood cells 8.6, hemoglobin 13.7, hematocrit 39.9, platelets 211, ANC 5.0, MCV 82.9.  CK 47, CK-MB 0.6, troponin 0.01.  PT 14, INR 1.1, PTT 37.  HOSPITAL COURSE: #1 - CHEST PAIN:  The patient was ruled out for MI with three sets of negative cardiac enzymes.  Repeat EKGs continued to show sinus bradycardia but no significant ST or T wave changes or Q waves.  A TSH level during hospitalization was normal at 2.446.  A fasting lipid panel was also unremarkable with cholesterol 124, triglycerides 119, HDL 38, and LDL 62.  #2 - BRADYCARDIA:  The patient was admitted on telemetry.  He remained bradycardic throughout hospitalization with a rate in the 40s-50s. Dr. Fraser Din was consulted, as she had performed a cardiac catheterization on the patient on October 14, 2000.  She did not recommend changing any of the patients medicines.  She recommended that an event monitor be performed to determine if the bradycardia is associated with dizziness or any symptoms, pacemaker is not indicated at this time.  She also  recommended starting nitrates and restarting the patients Atacand for hypertension.  She will follow up with the patient as above.  Her office will contact the patient to set up the event monitor.  #3 - TRANSIENT ISCHEMIC ATTACK:  The patients symptoms of ataxia and weakness resolved by hospital day #1.  The patient has known history of cerebrovascular disease with a history of a right CVA in March 2002 with a residual left hemiparesis, which was stable throughout hospitalization.  This brief,  acute ataxia and weakness is likely a TIA.  The patient is already on Plavix and aspirin.  The patient has been advised to make an appointment with an outpatient neurologist already by his primary doctor.  Again, the patient was urged to follow up with an outpatient neurologist.  No further workup was required at this time.  #4 - CHRONIC OBSTRUCTIVE PULMONARY DISEASE:  Stable on Advair and albuterol.  #5 - ANXIETY AND DEPRESSION:  The patients trazodone and Wellbutrin were briefly held in the setting of bradycardia; however, these were restarted on discharge, as the cardiology consult did not recommend continuing to hold them.  The patient did show symptoms of anxiety and depression during hospitalization but this was not a significantly active issue.  #6 - TOBACCO ABUSE:  Urged cessation.  #7 - OTHER MEDICAL PROBLEMS:  Hypothyroidism, hyperlipidemia, hypertension, low-back pain, coronary artery disease were not active issues during hospitalization; see above for lipid panel and thyroid TSH level.  INSTRUCTIONS FOR THE PATIENTS PRIMARY DOCTOR:  Please note that the patients Atacand was restarted and he was also started on Imdur.  Please note that the patient has been referred to a psychiatrist and instructed to self-refer to an outpatient neurologist.  Please also note that the patient will undergo an event monitor by Dr. Faythe Ghee office. Dictated by:   Lucille Passy, M.D. Attending Physician:  Tobin Chad DD:  01/15/02 TD:  01/18/02 Job: [id number]

## 2011-01-18 NOTE — Discharge Summary (Signed)
[place]  Patient:    MJ, WILLIS Visit Number: [id number] MRN: [id number]          Service Type: OUT Location: EKG Attending Physician:  McDiarmid, Leighton Roach. Dictated by:   Kinnie Scales. Reed Breech, M.D. Admit Date:  02/12/2002 Discharge Date: 02/12/2002   CC:         Nilda Simmer, M.D.  Eula Flax, M.D.  Einar Crow, M.D.  Dr. Marcheta Grammes, Neurology   Discharge Summary  DISCHARGE DIAGNOSES:  1. Conversion disorder.  2. Chest pain and paresthesias secondary to #1.  3. History of stroke and questionable transient ischemic attack history.  4. Insomnia.  5. Hypothyroidism.  6. Tobacco abuse.  7. Asthma.  8. Questionable history of angina.  9. Chronic obstructive pulmonary disease. 10. Hypertension. 11. Depression. 12. Hypercholesterolemia. 13. Chronic pain. 14. Bradycardia.  CONSULTATIONS: 1. Dr. Marcheta Grammes. 2. Dr. Leonides Cave.  PROCEDURES: 1. MRI/MRA which did not show any acute stroke. It did show intracranial    disease especially affecting the posterior circulation. 2. ABI as an outpatient which did show 0.8 for the average primarily on the    left side. 3. Carotid Dopplers recently done which did not show significant disease. 4. Lower extremity Doppler which was negative for deep venous thrombosis. 5. Holter monitor which showed sinus bradycardia, occasional premature    ventricular contractions and short atrial runs with symptoms associated    with sinus bradycardia.  DISCHARGE MEDICATIONS:  1. Altace 2.5 mg per day.  2. Plavix 75 mg per day.  3. Valium p.r.n.  4. Ambien 10 mg q.h.s.  5. Levoxyl 25 mcg q.d.  6. Wellbutrin 150 mg b.i.d.  7. Advair 250/50 one puff b.i.d.  8. Aspirin 81 mg p.o. q.d.  9. Imdur 30 mg q.d. 10. Albuterol p.r.n. 11. Atrovent q.i.d. 12. Lexapro 20 mg q.d. 13. Trazodone was held. 14. Advicor 40 mg q.d.  DIET:  Recommendation for a low salt diet, eat more fruits and vegetables and seek  counseling.  BRIEF HISTORY AND PHYSICAL:  On the night prior to discharge the patient presented with a headache 8/10, right arm and hand and right leg weakness.  He had increased falls and wobbly gait for several weeks, but this had been worsened recently.  He was evaluated in Dr. Donne Hazel office prior for his intermittent chest pain and a Cardiolite was recommended as an outpatient. The patient did undergo carotid Doppler screening and peripheral artery screening in the past two weeks. His wife is concerned that his symptoms are not improving.  The patient was admitted for rule out stroke, chest pain evaluation and to determine what exactly neurologically and psychiatrically is going on.  HOSPITAL COURSE:  #1 - NEUROLOGY:  With a negative MRI, normal lower extremity Dopplers, carotid Dopplers and a stable ABI, it was thought that the patients symptoms may be manifest from a conversion disorder.  Dr. Marcheta Grammes did evaluate the patient and brought the point up that this could be a conversion disorder.  The patient was able to ambulate and did not have any known gait weakness.  The patient was very dramatic on evaluation and upon talking to him does develop symptoms when queued by his wife or a physician.  It was discussed that even though the patient has falls which were likely not neurologic, that Coumadin was not a good choice for him.  Dr. Leonides Cave of neuropsychiatry evaluated the patient and agreed with conversion disorder.  He did discuss this with the wife and encouraged  her to try to provide a more positive reinforcement environment, not to feed off of patients symptoms and that they need individual and group counseling upon discharge.  #2 - CARDIAC:  The patient was stable from a cardiac standpoint.  Due to his neurologic symptoms a Cardiolite was not appropriate as an inpatient and is to be followed as an outpatient for his Cardiolite.  He did have bradycardia, however, it was not  symptomatic.  The patients Trazodone was held and Altace was started for both protection and stabilization.  We do worry about fluid changes and potential exacerbation of orthostatic hypotension.  #3 - PULMONARY:  Stable COPD.  His medications were continued.  He was encouraged to perform tobacco cessation.  #4 - INSOMNIA:  He was continued on Ambien and Trazodone.  The ABI average was 0.7 and this can be followed up with cardiology.  We did discuss with the patient the best benefits for ambulating and tobacco cessation.  #5 - PSYCHIATRY:  Please see Dr. Darolyn Rua note in the chart.  The patient was on Lexapro 20 mg in addition to Wellbutrin, but due to his conversion disorder it is unlikely that he will have minimal benefit from these medications.  DISPOSITION: The patient was discharged in stable condition.  FOLLOW-UP:  He is to follow-up with his primary physician at [facility].  DISCHARGE HISTORY AND PHYSICAL:  The patient is stable after the diagnosis of conversion disorder.  Many of his symptoms did seem to correlate with this. It was discussed with him to try to relax, use Valium and broadening techniques if he started to develop these symptoms.  After his Cardiolite, as long as his cardiac standpoint appears stable, that it is reassuring that he is not going to die of impending doom which he is worried about. The patient is to follow-up with his primary physician as well as Dr. Chales Abrahams from [facility] as an outpatient.      HOSPITAL COURSE: Dictated by:   Kinnie Scales. Reed Breech, M.D. Attending Physician:  McDiarmid, Tawanna Cooler D. DD:  03/04/02 TD:  03/08/02 Job: [id number]

## 2011-01-18 NOTE — Cardiovascular Report (Signed)
NAME:  Carlos [name], Carlos [name] NO.:  [id number]   MEDICAL RECORD NO.:  [id number]          PATIENT TYPE:  OIB   LOCATION:  NA                           FACILITY:  [facility]   PHYSICIAN:  Jonelle Sidle, M.D. LHCDATE OF BIRTH:  [birth year]   DATE OF PROCEDURE:  02/07/2005  DATE OF DISCHARGE:                              CARDIAC CATHETERIZATION   PHYSICIANS:  1.  Hoyle Sauer, M.D., primary care physician.  2.  Charlaine Dalton. Sherene Sires, M.D., pulmonologist.  3.  Georgia Lopes, M.D., oral surgeon  4.  Jonelle Sidle, M.D. cardiologist.   INDICATIONS:  Mr. Boen is a 60 year old male with known coronary artery  disease status post previous Cypher stent placement to the right coronary  artery in September 2004 by Dr. Veneda Melter as well as previous intervention  to the right iliac artery. He was recently evaluated as part of preoperative  evaluation with an Adenosine Myoview. This study was performed on January 31, 2005 and read by Dr. Myrtis Ser to reveal a question of possible ischemia in the  mid apical inferior wall with an overall ejection fraction of 57%. Mr.  Kautzman has been having some chest discomfort with some typical and atypical  features. He is referred now for definitive coronary angiography to reassess  coronary anatomy and evaluate for stent patency.   PROCEDURES PERFORMED:  1.  Left heart catheterization.  2.  Selective coronary angiography.  3.  Left ventriculography.   DESCRIPTION OF PROCEDURE:  The area about both left and right femoral  arteries was cleaned and draped. Attention was initially turned to the right  femoral artery although the pulse was weak and it was noted at previous  catheterization that this was a difficult access with apparent subsequent  bleeding problems. Although a skin incision was made in this side the artery  was not accessed and arteriotomy was eventually obtained via the left  femoral artery. Standard preformed 6-French JL-4 and  JR-4 catheters were  used for selective coronary angiography and angled pigtail catheter was used  for left ventriculography. All exchanges were made over wire. The patient  tolerated suture well without any complications.   HEMODYNAMIC:  Left ventricle 161/20 mmHg post angiography. Aorta 162/69  mmHg.   FINDINGS:  1.  The left main coronary artery is diffusely diseased with approximately      30-40% ostial stenosis. There is no damping on engaging the left main      coronary artery.  2.  The left anterior descending is also a diffusely diseased vessel,      although largely to mild degree approximately 30%. There is a      bifurcating proximal diagonal branches that has a 70% stenosis. No major      flow-limiting stenoses were noted within the left anterior descending      proper.  3.  There is a bifurcating ramus intermedius branch that has 80% diffuse      stenosis in the proximal segment prior to the bifurcation.  4.  The circumflex coronary artery is a medium caliber vessel with four  obtuse marginal branches. There is 40% stenosis in the proximal vessel      with 30% stenosis in the midvessel and other mild luminal      irregularities.  5.  The right coronary artery is a medium caliber vessel that is dominant      with a small posterior descending branch. A stent is visualized in the      proximal segment of vessel and this is noted to be patent with two areas      of approximately 30% in-stent restenosis. The remainder of the right      coronary artery is diffusely diseased at 30-40%.   Left ventriculography was performed in the RAO projection revealed ejection  fraction approximately 60% with no significant mitral regurgitation.   DIAGNOSES:  1.  Coronary disease as outlined. There is a 30-40% left main stenosis, 40%      proximal circumflex stenosis, 80% proximal small ramus intermedius      stenosis, 70% proximal small diagonal stenosis, and a patent stent site       in the right coronary artery with only 30% in-stent restenosis and      essentially mild diffuse disease in the remainder of the right coronary      artery. Anatomy is relatively stable compared to previous angiography      report.  2.  Left ventricular ejection fraction approximately 60% with no significant      mitral regurgitation and left ventricle end-diastolic pressure 20 mmHg.   RECOMMENDATIONS:  I reviewed the results with the patient. At this point, I  would anticipate continuing medical therapy. Imdur was just increased  yesterday and we will plan to maintain his present regimen. Doubt that any  specific percutaneous revascularization approaches would be adopted at this  time. There are some obstructive stenoses noted although these are  predominately in branch vessels. We will plan to have him follow-up in the  clinic for groin check and continue with observation medications.        SGM/MEDQ  D:  02/07/2005  T:  02/07/2005  Job:  [id number]   cc:   Hoyle Sauer, M.D.   Charlaine Dalton. Sherene Sires, M.D. [facility]   Georgia Lopes, M.D.  [address]  [place]  [place] [address]  Fax: [phone]   Jonelle Sidle, M.D. [facility]

## 2011-01-18 NOTE — Procedures (Signed)
EEG NUMBER:  [id number]   HISTORY:  This is a 60 year old patient who is being evaluated for seizures.  This is a routine EEG.  No skull defects are noted.   MEDICATIONS:  Medications include Zocor, Protonix, Synthroid, Cymbalta,  Atrovent, prednisone, Ambien, Altace, aspirin, trazodone, Wellbutrin,  Plavix, Isordil, Xopenex.   EEG CLASSIFICATION:  Normal, awake and asleep.   DESCRIPTION OF RECORDING:  The background rhythm of this recording consists  of a fairly well-modulated medium-amplitude 9-Hz alpha activity that is  reactive to eye opening and closure.  As the record progresses, the patient  initially appears to be in a waking state.  As the record progresses, the  patient enters the drowsy state and eventually appears to enter early stage  II sleep with rudimentary sleep spindles seen.  Towards the end of the  recording, the patient was re-alerted and photic stimulation is performed,  resulting in a bilateral and symmetric photic driving response.  Hyperventilation was not performed.  At no time during the recording does  appear to be evidence of spikes, spike wave discharged or evidence of focal  slowing.  EKG monitor shows very frequent premature ventricular complexes  with a heart rate of 60.   IMPRESSION:  This is a normal EEG recording in awake and sleeping states.  No evidence of ictal or interictal discharges are seen.  Frequent premature  ventricular complexes were seen during this study.      [name], M.D.  Electronically Signed     [id number]  D:  11/26/2005 19:30:44  T:  11/28/2005 11:01:29  Job #:  [id number]

## 2011-01-18 NOTE — H&P (Signed)
NAMEJACHAI, OKAZAKI                          ACCOUNT NO.:  [id number]   MEDICAL RECORD NO.:  [id number]                   PATIENT TYPE:  INP   LOCATION:  5738                                 FACILITY:  [facility]   PHYSICIAN:  Leighton Roach McDiarmid, M.D.             DATE OF BIRTH:  1950-10-23   DATE OF ADMISSION:  04/18/2002  DATE OF DISCHARGE:                                HISTORY & PHYSICAL   CHIEF COMPLAINT:  Abdominal pain and distention.   HISTORY OF PRESENT ILLNESS:  This is a 60 year old white male who presented  to the emergency department with severe abdominal pain that progressively  worsened over the last week.  The patient had increased abdominal distention  for three days and started having nausea last night.  The patient denied any  fevers or chills, and said that he felt hot when he became nauseous.  His  last bowel movement was yesterday at 3 p.m.  It was a hard stool and not  bloody.  He did have increased straining.  This was his second bowel  movement for the day.  The patient describes the pain as diffuse, mostly  periumbilical, sharp pain and constant all day yesterday.  The patient did  not take any medications for pain.  He does have a history of constipation  six months ago while using a Duragesic patch.  He denies any abdominal  surgeries or weight loss.  In fact, he states that he had a recent 20-pound  weight gain over the last three weeks, and he has been drinking excessive  amounts of water.  The patient has been eating well.  He ate yesterday  without any nausea, vomiting or diarrhea.  He denies any early satiety.  He  states that his abdominal pain does radiate to the mid sternum and right  flank.  The greatest amount of pain he describes in the right upper  quadrant, periumbilical, and left lower quadrant.   REVIEW OF SYSTEMS:  CONSTITUTIONAL:  The patient is fatigued and alert and  oriented times three in the emergency department.  CARDIOVASCULAR:   Chest  pain is mid sternal.  There is no dyspnea associated with chest pain, and  the patient states that this pain shoots up from his abdomen.  GASTROINTESTINAL:  Periumbilical pain, sharp and constant, relieved by  Dilaudid.  NEUROLOGIC:  The patient is alert and oriented times three.  MUSCULOSKELETAL:  The patient denies myalgias.  ENT:  There are no upper  respiratory infection symptoms.  RESPIRATORY:  The patient states he has had  increased shortness of breath over the last week with chronic respiratory  complaints.  SKIN:  The patient denies any rash.  PSYCHIATRIC:  The patient  does have a lot of anxiety.  EYES:  There is no blurred vision.  GENITOURINARY:  No dysuria and no trouble voiding.   PAST MEDICAL HISTORY:  The patient's past medical  history is significant for  hypothyroidism, hyperlipidemia, major depression, anxiety, smoking,  hypertension, arteriosclerosis, cerebrovascular accident, intermittent  claudication, deep venous thrombosis, chronic obstructive pulmonary disease,  gastric ulcer with hemorrhage, hiatal hernia, low back pain and angina.  The  patient had conversion disorder in May 2003.   PAST SURGICAL HISTORY:  Past surgeries include a right lobectomy (unsure  which lobe).  Last colonoscopy was in 2002.  The patient did have colon  polyps removed at that time.  The patient denies any abdominal or colon  surgery.   CURRENT MEDICATIONS:  Medications include  1. Advair Diskus 250/50 b.i.d.  2. Albuterol p.r.n.  3. Altace 1.25 mg q.d.  4. Ambien 10 mg q.h.s.  5. Aspirin 81 mg q.d.  6. Atrovent metered dose inhaler p.r.n.  7. Hydrochlorothiazide 12.5 mg p.o. q.d.  8. Imdur 30 mg q.d.  9. Klonopin 0.5 mg p.o. t.i.d.  10.      Levoxyl 25 mcg q.d.  11.      Lexapro 20 mg q.d.  12.      MiraLax one teaspoon in 8 ounces of water.  13.      Pepcid 20 mg p.o. b.i.d.  14.      Plavix 75 mg p.o. q.d.  15.      Simethicone 40 to 80 mg p.o. q.i.d. p.r.n.  16.       Trazodone 150 mg one-half tablet q.h.s.  17.      Valium 10 mg one-half tablet p.r.n.  18.      Vitamin B6 q.d.  19.      Wellbutrin 150 mg b.i.d.   ALLERGIES:  Allergies include PENICILLIN and SULFA.   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  The  patient is in a very labile relationship with his fourth marriage and has a  bipolar stepdaughter.  The patient denied any alcohol or IV drug use.   FAMILY HISTORY:  Father died of brain cancer.  Mother died of old age.  Sister has a history of colon cancer with resection.   PHYSICAL EXAMINATION:  VITAL SIGNS:  Temperature 97.0, blood pressure  159/78, pulse 74, respirations 18.   GENERAL:  The patient is alert and oriented times three and appears to be  fatigued.  He has been seen after being given Dilaudid in the emergency  department.   HEENT:  Head is normocephalic and atraumatic.  Pupils equal, round and  reactive to light and accommodation.  Extraocular muscles are intact  bilaterally.  Nasal septum is midline.  Posterior pharynx is without  injection or exudate.  Mucous membranes are pink and moist.  The patient  does have poor dentition.   NECK:  There are no masses or lymphadenopathy.  The patient denied any  carotid bruit or JVD.   CARDIOVASCULAR:  Regular rate and rhythm without any murmurs.  The patient  has +2 out of 2 pulses present bilaterally.   RESPIRATORY:  There are no breath sounds over the right lower lobe status  post lobectomy.  The patient does not have any tachypnea, and there are  scattered wheezes present, mostly in the left lower lobe.   MUSCULOSKELETAL:  The patient has +5 out of 5 muscle strength bilaterally.   GASTROINTESTINAL:  There are hyperactive bowel sounds present x4 quadrants.  The abdomen did not appear to be tense though it appears to be  fluid-filled. The patient has some right upper  quadrant tenderness to palpation and right  costovertebral angle tenderness.  The abdomen is tympanic to percussion.  There are no masses palpated.  There is no rebound, guarding, or rigidity  noted.   NEUROLOGIC:  Cranial nerves II through XII are grossly intact bilaterally.  The patient is alert and oriented times three and appears to be slightly  nervous.   RECTAL:  Examination is negative performed in the emergency department.  Stool is guaiac negative and the examination is nontender.   LABORATORY DATA:  Laboratory performed in the emergency department include a  CBC with a white blood cell count of 10.5 with 80% neutrophils and absolute  neutrophil count of 8.4, hemoglobin 15.6, hematocrit 46.3, and platelet  count 236,000.  Comprehensive metabolic panel showed a sodium of 141,  potassium 4.6, chloride 99, bicarbonate 31, BUN 8, creatinine 1.4, glucose  125, calcium 9.8, total protein 7.3, albumin 3.7, AST 22, ALT 22, alkaline  phosphatase 75, total bilirubin 0.9, amylase was 189, lipase 20.  Urinalysis  was negative with a specific gravity of 1.014.  Arterial blood gases showed  a pH of 7.42 with a pCO2 of 48, and a bicarbonate of 32.  An acute abdominal  series performed in the emergency department showed a small bowel  obstruction.   ASSESSMENT AND PLAN:  This is a 60 year old white male admitted for small  bowel obstruction.  1. For small bowel obstruction we are going to keep the patient n.p.o.,     place a nasogastric tube to suction if vomiting occurs.  We will consider     abdominal ultrasound to rule out gallstones due to increased amylase.  We     will obtain a CT of the abdomen with contrast to look more closely at the     cause of small bowel obstruction to rule out tumors.  2. History of colon polyps.  The patient's last colonoscopy was May 2002 per     [facility].  3. Status post lung resection on the right with questionable history of     aspergillosis.  Will continue  all respiratory medications.  4. Emphysema.  Will continue Advair and albuterol nebulizer treatments.  5. History of cerebrovascular accident with residual left hemiparesis in     March 2002, and a transient ischemic attack in November 2002.  We will     continue Plavix 75 mg p.o. q.d.  6. Hypertension.  We will continue Altace and hydrochlorothiazide.  7. Renal insufficiency with a creatinine of 1.4.  Note that MEMR states that     the patient had a creatinine of 1.4 in May 2002.  We will continue to     monitor renal function.     Lorne Skeens, D.O.                         Etta Grandchild, M.D.    Erick Alley  D:  04/18/2002  T:  04/21/2002  Job:  [id number]

## 2011-01-18 NOTE — H&P (Signed)
NAMEDIERKS, WACH NO.:  [id number]   MEDICAL RECORD NO.:  [id number]          PATIENT TYPE:  INP   LOCATION:  2038                         FACILITY:  [facility]   PHYSICIAN:  Verne Grain, MD   DATE OF BIRTH:  14-Jul-1951   DATE OF ADMISSION:  11/23/2005  DATE OF DISCHARGE:                                HISTORY & PHYSICAL   PRIMARY CARE PHYSICIAN:  Dr. Thomos Lemons   PRIMARY LUNG PHYSICIAN:  Dr. Sandrea Hughs   PRIMARY CARDIOLOGIST:  Dr. Diona Browner   CHIEF COMPLAINT:  Chest pain/right-sided pain.   HISTORY OF PRESENT ILLNESS:  A 60 year old male, history of hypertension,  hyperlipidemia,ongoing tobacco, COPD, with history of lung resection for  lung cancer/embolectomy, anxiety/depression, chronic pain, peripheral  vascular disease status post right iliac stent in 2004, early coronary  artery disease status post right coronary artery Cypher stent in 2004 with  most recent cardiac catheterization in June 2006 that showed mostly  nonobstructive coronary artery disease with some branch vessel disease (see  past medical history) with recommendations for medical management at that  time. The patient was reportedly evaluated more recently by Dr. Diona Browner for  complaints of increasing shortness of breath and chest pain with exertion  such as vacuuming the floor that resolves with rest. These complaints have  reportedly been increasing over the past several months and prompted  reported outpatient catheterization scheduled for next Tuesday (November 26, 2005). However, in the interim the patient has been reportedly having more  chest pain including chest pain at rest with shortness of breath and  diaphoresis prompting his report to the emergency room for further  evaluation. In the emergency room his EKG reveals no changes diagnostic of  ischemia and no notable changes other than a PVC and PAC when compared to a  previous EKG from September 2004. A CK-MB and troponin are  negative x1. A  spiral CT to evaluation lung parenchyma and exclude pulmonary embolism was  negative. In addition to chest pain complaints, the patient also has  multiple other complaints including cough that has been more recently  productive of phlegm, as well as diffuse abdominal pain and chest pain  including paroxysms of right-sided pain that are increased with palpation as  well as position (worse on lying on his right side), with episodes of acute  exacerbations in right-sided pain accompanied by the patient's facial  grimace and complaining of agony and leaning forward. The patient has a  history of atypical nonorganic pain and alterations in mental status per  previous notes. The patient also has a history of gallstones. He denies  fevers or chills. He has no clear correlation between his symptoms and food  although he reports that he has not been eating well. In regards to his  abdominal discomfort, he has reported a sense of constipation but also has  noted recent loose stools.   ALLERGIES/ADVERSE REACTIONS:  1.  PENICILLIN.  2.  SULFA.   CURRENT MEDICATIONS:  1.  Bupropion 150 mg p.o. b.i.d.  2.  Trazodone 100 mg p.o. q.h.s.  3.  Plavix  75 mg p.o. daily.  4.  Imdur 60 mg p.o. daily.  5.  Lasix 20 mg p.o. daily p.r.n.  6.  Zocor 40 mg p.o. q.h.s.  7.  Protonix 40 mg p.o. daily.  8.  Cymbalta 20 mg p.o. daily.  9.  Levothyroxine 100 mcg p.o. q.h.s.  10. Spiriva inhaler.   PAST MEDICAL HISTORY:  1.  Coronary artery disease status post 2.75 x 33 mm Cypher stent to the      right coronary artery as well as a 4 x 29 mm Genesis stent to the right      iliac (Dr. Chales Abrahams) with most recent cardiac catheterization in June 2006      performed via the left femoral artery (Right femoral artery reportedly      had a weak pulse and difficulty with access. History of right groin      hematoma with cardiac catheterization in 2004 extending into the      scrotum). Cardiac  catheterization results via the left femoral artery in      June 2006:  Left main 30-40%, LAD 30%, proximal diagonal branch 70%,      ramus bifurcating with an 80% proximal to the bifurcation, left      circumflex 40%/30%, right coronary artery with 30% in-stent restenosis      and 30-40% diffuse stenosis with recommendations for medical management.  2.  Peripheral vascular disease status post genesis stent to the right iliac      in 2004.  3.  History of stroke and TIA.  4.  Hypertension.  5.  Hyperlipidemia.  6.  History of anxiety/depression/chronic pain/insomnia.  7.  History of ongoing tobacco use/COPD/history of lung cancer with lung      resection and bulla resection by Dr. Algis DownsGeorg Ruddle at [facility]  8.  History of gastroesophageal reflux.  9.  History of hypothyroidism.  10. History of gallstones.   SOCIAL HISTORY:  The patient lives in [place] with his wife of 12 years  and his granddaughter. He smokes one pack of cigarettes per day. He consumes  no alcohol on a regular basis. He denies any illicit drug use.   FAMILY HISTORY:  The patient's mother died at age 34 of being worn out. No  specific medical illnesses known. The patient's father died at age 108 with  lung disease and heart disease and what appears to be lung cancer with brain  metastases. The patient has one sister who is age 94 who has psychiatric  problems and also a baggy colon status post colon resection. The patient  has one biological child whose medical history is unknown.   REVIEW OF SYSTEMS:  No recent fevers or chills. No acute alterations in  auditory or visual acuity. The patient reports no acute rash. He does have  chest pain. He does have shortness of breath. He does have dyspnea on  exertion. No orthopnea or PND or palpitations reported. No presyncope,  although he does have a history of claudication, positive cough and wheezing  with cough productive of phlegm. The patient has no urinary symptoms. He has  history of depression and anxiety. No acute joint complaints although he  does have chronic low-back pain. He also complains of abdominal discomfort  and a sense of constipation but also reported recent loose stools. No  overt nausea or vomiting. No melena or hematemesis. No polyuria or  polydipsia. No heat or cold intolerance. No skin or hair changes suggestive  of endocrinologic abnormality.  PHYSICAL EXAMINATION:  VITAL SIGNS:  Temperature 97.7, respiratory rate 16,  heart rate 76, blood pressure 134/58, oxygen saturation 92% on room air.  GENERAL:  The patient in general is cooperative. He is alert. He answers  questions appropriately, although sometimes slowly, and the patient's wife  reports that after his stroke it sometimes takes him a while to compose his  thoughts. He also has paroxysms of right-sided pain that are accompanied by  him leaning forward with a facial grimace. Etiology of these symptoms  unclear. The appearance somewhat unusual and not clearly typical for any  specific diagnosis.  HEENT:  He is normocephalic, atraumatic. Oropharynx is pink and moist  without lesions.  NECK:  Supple. There are no carotid bruits. There is no jugular venous  distention. There is no palpable lymphadenopathy or thyromegaly.  CARDIOVASCULAR:  Reveals a regular S1 and a regular S2.  LUNGS:  Lung fields reveal occasional scattered wheezes bilaterally.  SKIN:  Reveals no acute rash.  ABDOMEN:  Overall soft, mildly obese, positive bowel sounds but some  tenderness with palpation, mostly diffuse, perhaps slightly increased in the  right upper quadrant/right flank.  RECTAL:  Reveals no obstruction. Heme negative brown stool. No palpable  masses.  NEUROLOGIC:  Grossly nonfocal, although gait was not tested.   Chest x-ray:  The patient's spiral CT/chest x-ray revealed changes of COPD  but no focal abnormalities were identified. There was no evidence of  pulmonary embolism.   EKG:  Sinus  rhythm at a rate of 70 with a leftward axis but normal PR, QRS,  and QTC intervals (PR interval is at the upper limits of normal, just under  200 milliseconds). Q waves are not present. There are no changes diagnostic  of ischemia. There is no evidence of left ventricular hypertrophy. Premature  ventricular contraction and premature atrial contraction are noted. Other  than that, there are no other significant changes when compared to an EKG  obtained in September 2004.   LABORATORY VALUES:  White blood cell count 11 with 71% neutrophils,  hematocrit 44, platelet count 235. Sodium 136, potassium 4.2, chloride 104,  bicarb 27, BUN 10, creatinine 1.4, glucose 96, total bilirubin 1.0, AST 14,  ALT 13, alkaline phosphatase 66. Myoglobin 114. Lipase 19. CK-MB less than  1.0, troponin I less than 0.05. PTT 35, INR 1.0. A pH on venous blood gas  7.38.   ASSESSMENT AND PLAN:  A 60 year old male, hypertension, hyperlipidemia, ongoing tobacco use despite chronic obstructive pulmonary disease, history  of lung cancer resection and bullectomy, chronic pain/anxiety, history of  nonorganic pain, alternations in mental status, also with history of  stroke/transient ischemic attack, peripheral vascular disease status post  right iliac stent in 2004, coronary artery disease status post right  coronary artery stent in 2004 with most recent cardiac catheterization June  2006 with diffuse nonobstructive disease and branch vessel disease.  Recommendation for medical management at that time but more recent  complaints of exertional chest pain and shortness of breath prompted  scheduling of a cardiac catheterization reportedly for next Tuesday.  Symptoms occurring at rest and with increased severity prompting the  patient's current emergency room presentation with emergency room  evaluation, CK-MB, troponin, EKG, and spiral CT of the chest all negative.  Also with right-sided abdominal, chest, right upper  quadrant pain that is  somewhat paroxysmal in nature and of unclear etiology. Review of systems  notable for cough with recent phlegm production. Also a sense of  constipation accompanied  by reports of occasional loose stools.   1.  Chest pain. Continue aspirin, Imdur. Will add unfractionated heparin and      ramipril 2.5 mg p.o. daily. Will hold beta blocker with the patient's      wheezing as noted above. Will also hold Plavix until right upper      quadrant ultrasound can be obtained to exclude cholecystitis with the      patient's right-sided pain complaints. Will continue to encourage      tobacco cessation. Monitor on telemetry. Rule out myocardial infarction      with cardiac markers and serial EKGs. Plan for cardiac catheterization      on Monday or Tuesday per Dr. Ival Bible previous recommendations as      related by the patient.  2.  Paroxysmal right-sided/right upper quadrant pain of unclear etiology and      significance. The patient has a history of nonorganic pain and      alterations in mental status in the past. He has lipase and LFTs that      are normal. The pain is not clearly typical for any specific diagnosis      that I can think of. However, he does have a history of gallstones.      Therefore, we will check a GGT and a right upper quadrant ultrasound      along with a KUB to exclude any obvious stones. Will also check a      urinalysis to exclude any evidence of hematuria or infection.  3.  Heme negative stool with complaints of a sense of constipation but      also with recent loose stools. In addition to checking the KUB and      right upper quadrant ultrasound as previously mentioned, will also send      stool for stool studies with C. Difficile, fecal leukocytes, culture,      O&P. Will treat symptoms of constipation empirically with magnesium      citrate as the patient reports that he takes this at home with good     results. Plus/minus Fleets enema if  needed. Will maintain on a clear      liquid diet until abdominal symptoms improve and right upper quadrant      ultrasound excludes cholecystitis (also holding Plavix until right upper      quadrant ultrasound is read).  4.  Hypothyroidism. Continue Synthroid as previously prescribed and check      TSH and free T4 to confirm euthyroid state on Synthroid supplementation.  5.  History of chronic obstructive pulmonary disease/tobacco/shortness of      breath/cough recently productive with some scattered wheezes on      examination. Will treated with albuterol and Atrovent nebulizer      treatments and follow up steroids that the patient was given in the      emergency room with a brief prednisone taper. Continue to encourage      tobacco cessation and follow up with Dr. Sherene Sires.  6.  Tobacco use. Continue to encourage tobacco cessation as mentioned above.  7.  Hyperlipidemia. Will continue Zocor as previously prescribed. The      patient's LFTs are normal. Will check a lipid profile in the morning to      confirm that his lipids are at goal.  8.  Hypertension. Will add ramipril 2.5 mg p.o. daily to help optimize his      current regimen. Could consider beta blocker therapy with  a beta 1      selection agent low-dose if the patient is without wheezing during the      remainder of his hospitalization.  9.  History of gastric reflux. Continue PPI as previously prescribed.  10. History of anxiety and depression. Continue bupropion, trazodone,      Cymbalta as previously prescribed and check thyroid profile as      previously mentioned.           ______________________________  Verne Grain, MD     DDH/MEDQ  D:  11/23/2005  T:  11/25/2005  Job:  [id number]

## 2011-01-18 NOTE — Discharge Summary (Signed)
[place]  Patient:    Carlos [name], Carlos [name]                     MRN: [id number] Adm. Date:  [id number] Disc. Date: [id number] Attending:  Young, Copy CC:         Lacretia Leigh. Quintella Reichert, M.D.  [name], M.D.  Peter M. Swazil[place], M.D.  Anselmo Rod, M.D.   Discharge Summary  DISCHARGE DIAGNOSES:  1. Pleuritic musculoskeletal pain, improved.  2. Coronary artery disease, small nondominant right coronary artery and     diagonals, diffuse.  3. Arterial peripheral vascular disease with bilateral aortoiliac stenosis,     mild.  4. Cerebrovascular disease, status post cerebrovascular accident/thalamic     stroke.  5. Hypertension.  6. Tobacco abuse.  7. Bullous emphysema, extremely severe, oxygen dependent.  8. 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  9. Anxiety with limited insight. 10. Transient acute delirium and confusion, probably from anxiety plus     medication. 11. History of gastrointestinal bleed, not confirmed, status post     nondiagnostic colonoscopy. 12. Chronic back pain. 13. Gastroesophageal reflux disease with hiatal hernia.  HISTORY OF PRESENT ILLNESS:  This is a 60 year old, white male, active smoker, who presented with cough, pleuritic pain, nausea and vomiting, increased shortness of breath and chest wall tenderness and report from family that he had had a brief associated syncopal event without motor seizure or incontinence.  PAST MEDICAL HISTORY:  1. History of chronic and shifting chest pains.  2. Cardiac catheterization in February 2002, showing severe diffuse disease     with small nondominant right coronary artery, 80% proximal first diagonal     branch, normal left ventricular function with 50-60% eccentric plaque at     the ostium of the right iliac artery with no obstructive disease in  the     left side.  3. Severe COPD with bullous emphysema.  4. Active tobacco abuse.  5. Cerebrovascular disease with history of thalamic stroke, previously     evaluated.  6. Colon polyps.  7. Coronary artery disease.  8. Depression/anxiety.  9. Hypertension. 10. Post-traumatic deep venous thrombosis, right leg. 11. Chronic back pain. 12. Remote history of TB exposure with negative PPDs. 13. Hiatal hernia.  PHYSICAL EXAMINATION:  VITAL SIGNS:  Temperature 98, pulse 100, BP 130/80.  CHEST:  He appeared acutely ill with respiratory distress due to right-sided pleuritic chest pain, distant breath sounds, inspiratory wheezes, point tenderness in the right lateral chest wall and mid axillary line.  HEART:  Distant heart tones not well-defined without murmur.  ABDOMEN:  Soft, nontender, without hepatosplenomegaly.  EXTREMITIES:  Without edema, clubbing or obvious venous disease.  NEUROLOGIC:  Intact with note of anxiety.  LABORATORY DATA AND X-RAY FINDINGS:  Initial arterial blood gas on 1 L prong showed pH 7.45, pCO2 43, pO2 84.  Chest x-ray showed bullous emphysema and lower lung zone scarring without acute infiltrate seen.  HOSPITAL COURSE:  He received counseling against smoking.  Spiral CT showed no evidence of pulmonary embolism.  A substantial anxiety component was recognized and the family had difficulty as well with anxiety.  Dr. Peter Swaziland saw him for cardiology consultation.  There was no evidence of acute myocardial infarction and he agreed with current therapy emphasizing smoking cessation and blood pressure control.  He ordered lower extremity arterial Dopplers and did not feel that intervention was indicated.  Fiberoptic bronchoscopy was performed to evaluate right middle and superior segment right lower lobe densities in the context of extremely severe bullous emphysema. Biopsies were not attempted because of the underlying lung disease, but brushing and  washing cytologies returned atypical, suspicious for nonsmall cell carcinoma.  The family was counseled with the understanding that this represented an 80-90% probability that this was indeed bronchogenic carcinoma. He had been previously scheduled for outpatient endoscopy and Dr. Elsie Amis was able to see him while here and attempted colonoscopy which was nondiagnostic because of inadequate prep.  He was anxious and hypertensive acutely after the colonoscopy, but blood pressure came down with resumption of medication.  He had an interval while lying in bed prior to his colonoscopy and about one day after his bronchoscopy, when he reportedly saw hand prints on the wall and mice in his coffee and was tremulous.  This responded to reorientation and low dose Ativan and was thought to reflect an acute transient delirium reflecting combination effects of his previous stroke, several medications and anxiety. There was no recurrence.  The family was extremely anxious and received substantial counseling time for me.  Code status was discussed and wife was shown his CT scan demonstrating a truly severe diffuse bullous emphysema. They had not yet been able to reach a decision, but currently felt he should be full code.  He was covered with Rocephin and Zithromax against the possibility that some of his abnormal chest x-ray picture might represent some pneumonia.  On steroids, white blood count surged after bronchoscopy and then began to settle again as steroids were withdrawn.  There was also elevation of glucose while on steroids and steroid therapy was discontinued to avoid complications.  Exacerbation of chronic wheezing and dyspnea from the time of admission responded to the initial steroid therapy, supplemental oxygen and  bronchodilators.  He was discharged having reached maximum hospital benefit after very detailed discussions with the patient and family covering the spectrum of his multiple  severe illnesses, smoking cessation, oxygen therapy and followup options.  CONDITION ON DISCHARGE:  Improved.  LABORATORY DATA AND X-RAY FINDINGS:  Bronchial brushing ([id number]) atypical cells suspicious for nonsmall cell carcinoma.  Mixed flora on bronchial washing with final identification pending, but negative for AFB and only showing yeast on preliminary stains.  Admission WBC 10,900, peaked at 18,600 on steroids immediately after bronchoscopy and had fallen to 16,800 at the time of discharge.  Arterial blood gas at discharge on 3 L prongs with pH 7.3, pCO2 45, pO2 55, bicarb 29.  Chemistry panels significant for peak glucose of 220, 138 on repeat at discharge, BUN peaking at 55 with creatinine of 1.6 responding to saline with BUN of 46, creatinine 1.4 at discharge.  Cardiac enzymes showed no evidence of myocardial injury.  Liver enzymes and serum ammonia were within normal limits.  Arterial blood gas on 100% oxygen on admission showed pH 7.43, pCO2 43, pO2 84 and bicarb 29.  Chest x-ray showed severe chronic obstructive pulmonary disease, bilateral lower lobe opacities representing atelectasis.  CT scan showed no evidence of pulmonary emboli, severe bullous emphysema, rounded mass-like density in right middle lobe, atelectasis versus neoplasm with other areas of opacity in the superior segment right lower lobe and in left lower lobe likely representing scarring and/or atelectasis with no evidence of mediastinal or hilar adenopathy.  Comparison CT scans from February and April were reviewed with  the radiologist indicating progression of the air space opacity in the superior segment in the right lower lobe over this time and some growth also in the right middle lobe component.  An indolent inflammatory process was considered possible, but the findings were also consistent with bronchogenic carcinoma.  EKG showed normal sinus rhythm and normal EKG.  DISPOSITION:  He is discharged  home with family to begin home oxygen at 2-3 L per minute.  ACTIVITY:  As tolerated with absolute smoking cessation as discussed and options provided.  FOLLOWUP:  Office followup with Dr. Maple Hudson in two to three weeks and as scheduled for GI fellowship with Dr. Loreta Ave.  Primary care followup with Dr. Quintella Reichert.  DISCHARGE MEDICATIONS:  1. Plavix 75 mg q.d.  2. Aspirin 325 mg q.d.  3. Paxil 30 mg q.d.  4. Nebulizer with albuterol unit dose 2.5 mg q.i.d. p.r.n.  5. Diazepam/Valium using 5 mg tablets he already has at home, to take one     or two if needed p.r.n. for sleep and up to q.4h. sparingly p.r.n. anxiety     as discussed.  6. Desyrel 100 mg q.h.s.  7. Atacand HCT 32/12.5 mg one daily for BP.  8. Duragesic patch 50 mcg per hour q.72h. p.r.n., #6 provided.  9. Protonix 40 mg q.d. 10. Tiazac 240 mg q.d. DD:  01/16/01 TD:  01/19/01 Job: [id number]

## 2011-01-18 NOTE — Consult Note (Signed)
[place]. [facility]  Patient:    Carlos [name], Carlos [name]                     MRN: [id number] Proc. Date: 01/12/01 Adm. Date:  [id number] Attending:  Young, Copy CC:         Clinton D. Maple Hudson, M.D.   Consultation Report  HISTORY OF PRESENT ILLNESS:  Mr. Bruemmer is a 60 year old white male with history of hypertension, tobacco abuse, and diffuse vascular disease who presented on Jan 11, 2001 with nausea, vomiting, cough, and pleuritic chest pain.  The patient described this as a knife sticking in his back and radiating to his right and left anterior chest with some associated left arm numbness.  The pain was clearly worse with cough, deep breathing, and was also tender to touch.  The patient has had chest pain off and on for some time now of various characterizations.  He actually underwent cardiac catheterization in February 2002 by Dr. Fraser Din.  This showed severe diffuse disease and a small nondominant right coronary artery.  The first diagonal branch had an 80% stenosis proximally.  This vessel was only approximately 2 mm in diameter. The other vessels were without significant obstructive disease and left ventricular function was normal.  The patient apparently had a difficult cardiac catheterization due to access.  Followup abdominal aortography showed normal aorta without an aneurysm.  There was a 50-60% eccentric plaque at the ostium of the right iliac artery with no obstructive disease in the left side.  PAST MEDICAL HISTORY: 1. Severe COPD with bullous emphysema. 2. Tobacco abuse. 3. Cerebrovascular disease status post prior CVA. 4. Colon polyps. 5. Coronary artery disease. 6. Depression and anxiety. 7. Hypertension. 8. 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 9. Chronic back pain.  ALLERGIES:  PENICILLIN.  MEDICATIONS PRIOR TO ADMISSION: 1. Valium 2 mg t.i.d. 2. Atacand 32/12.5 mg daily. 3. Tiazac 300 mg daily. 4. Plavix 75  mg. 5. Aspirin 325 mg daily. 6. Advair inhaler. 7. Trazodone 100 mg q.h.s. 8. Albuterol inhaler. 9. Atrovent inhaler.  SOCIAL HISTORY:  The patient is an active smoker.  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  FAMILY HISTORY:  Father died of brain cancer.  REVIEW OF SYSTEMS:  The patient has complained of chronic right leg pain.  It seems to be worse with exertion but also occurs at rest.  It is localized to his left hip, lower back, and buttocks and it occasionally radiates to his knee.  He states his feet stay cold but do not change color.  Other review of systems is negative.  PHYSICAL EXAMINATION:  GENERAL:  The patient is a well-developed white male in no apparent distress who appears older than his stated age.  VITAL SIGNS:  Blood pressure 124/77, pulse 85 and regular.  He is afebrile. Saturations are 91-93%.  NECK:  He has no JVD or bruits.  LUNGS:  Clear except with diminished breath sounds throughout.  CARDIAC:  No gallops, murmurs, or clicks.  ABDOMEN:  Soft and nontender.  Bowel sounds positive.  He has no masses or bruits.  EXTREMITIES:  Femoral and pedal pulses are 1+ and symmetric.  His feet are warm and dry.  He has no edema.  LABORATORY DATA:  ECG shows normal sinus rhythm with nonspecific T wave abnormality.  Chest x-ray shows severe COPD with bilateral lower lobe opacities.  White count 10,900, hemoglobin 13,  platelets 341,000.  Sodium 139, potassium 4.4, chloride 102, BUN 16, creatinine 1.6, glucose 106.  CK-MBs have been negative x 3.  Troponins have been negative x 3.  IMPRESSION: 1. Pleuritic chest pain due to pneumonia versus musculoskeletal pain with    coughing.  The pain is clearly not anginal in nature.  It is exacerbated by    his overlying anxiety. 2. Coronary artery disease predominately involving a small nondominant right    coronary artery and small diagonal branches. 3. Peripheral  vascular disease with borderline disease at the ostium of the    right iliac artery.  I doubt that his current symptoms of leg pain are    ischemic in origin, probably represent more arthritic complaint or due to    low-back problems. 4. Cerebrovascular disease, status post cerebrovascular accident. 5. Hypertension. 6. Tobacco abuse. 7. Severe chronic obstructive pulmonary disease and emphysema.  PLAN:  I agree with your prior treatment.  Treatment emphasis needs to be on risk factor modification with smoking cessation, blood pressure control.  Need to assess lipid status.  Calcium channel blocker is a good choice for blood pressure control, as well as offering some anti-ischemic benefits.  Agree with current antiplatelet therapy with aspirin and Plavix.  I will obtain lower extremity arterial Dopplers to assess his lower extremity circulation and to serve as a baseline for follow-up.  I do not feel that he will need any further cardiac evaluation or intervention at this point. DD:  01/12/01 TD:  01/13/01 Job: [id number]

## 2011-01-18 NOTE — Consult Note (Signed)
[place]. [facility]  Patient:    Carlos [name], Carlos [name] Visit Number: [id number] MRN: [id number]          Service Type: MED Location: 3000 [id number] Attending Physician:  Doneta Public Dictated by:   Melvyn Novas, M.D. Proc. Date: 01/28/02 Admit Date:  01/28/2002   CC:         Carlos [name], M.D.   Consultation Report  REFERRING PHYSICIAN:  Kinnie Scales. Reed [name], M.D.  AGE:  60 years.  DATE OF BIRTH:  09/17/1950  HISTORY OF PRESENT ILLNESS:  This is a 60 year old, right-handed, Caucasian gentleman with a history of multiple medical disorders involving cardiac, pulmonary, oncology, and central nervous diseases.  In the past, the patient has been worked up multiple times for right-sided and left-sided weakness, TIA versus other transient weakness and numbness.  Today he presents with noted left-sided weakness.  In the past, most of his complaints were on the right. He has a history of nicotine abuse, anxiety disorder, panic disorder, and major depression.  He is on multiple medications.  PAST MEDICAL HISTORY:  1. Depression and anxiety.  2. Hypertension.  3. COPD.  4. Non-small-cell lung cancer.  5. 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  6. TIA.  7. Hypothyroidism.  8. Hyperlipidemia.  9. Chronic low back pain. 10. Cervical neck pain. 11. Coronary artery disease with stenosis. 12. Dysphagia. 13. Recurrent confusional state. 14. Question of absence seizures. 15. PVD with claudication in both lower extremities. 16. Hernia. 17. Gastroesophageal reflux disease. 18. Chronic gastritis.  REVIEW OF SYSTEMS:  According to the patients wife and daughters, who are present during the examination, noticed left-sided weakness for over five hours prior to the presentation in the ER.  He has had approximately three weeks of increasing problems walking and ambulating safely.  At home last night, he had a fainting spell in front of the refridgerator.  The day  before, he was evaluated by cardiology and was seen for his coronary artery disease. The cardiologist talked extensively with Mr. Cumpston and his family and he learned that a Cardiolite test was scheduled for next Wednesday.  His wife has noticed that he was very anxious about possible consequences and that his behavior changed right then, but this morning he became very scary, unattentive, almost catatonic, and withdrawn.  The patient himself had used the words dizziness, headache, weakness, tingling, and incoordinated at home, but is not choosing these words in the ER as he is very reluctant to speak.  SOCIAL HISTORY:  The patient is married.  He is a former smoker of three packs per day.  A retired Naval architect.  He is now on disability at the age of 83.  FAMILY HISTORY:  The patients father had coronary artery disease and hypertension and died at the age of 67 of a brain tumor.  MEDICATIONS:  1. Advair.  2. Albuterol.  3. Ambien.  4. Aspirin.  5. Plavix.  6. Pepcid.  7. Trazodone.  8. Imdur.  9. Atrovent. 10. MiraLax. 11. Lexapro. 12. Wellbutrin.  PHYSICAL EXAMINATION:  VITAL SIGNS:  Blood pressure 165/85, heart rate 60, respiratory rate 18 at rest.  HEART:  Distant heart sounds.  No murmurs.  ABDOMEN:  Soft.  Appears somewhat distended.  Positive bowel sounds.  The patient complains of left-sided flank pain under the rib cage, but has no pain with deep inspiration.  EXTREMITIES:  No peripheral edema.  Decreased peripheral pulses.  NEUROLOGIC:  Mental status is waxing and waning.  The  patient shows selective attention and alertness.  He either does the opposite of what I ask him to do for me or stares at the ceiling, refusing to make eye contact with me or his family members.  The wife reports that he has had variable attention at home and that yesterday after hearing that he needs a further cardiology work-up was anxious and upset since.  Cranial Nerve Exam:  The  pupils were equal to light and accommodation.  Extraocular movements are intact spontaneously observed.  When asked to follow a moving finger, the patient refuses to focus. A couple of minutes later, I have to move the IV bag and when I chose to move this in front of his face, back and forth and up, he is well able to focus and trace.  He shows no facial weakness.  The fundi are normal.  No tongue deviation to the side.  Gag is intact.  No facial sensory loss.  He reacts equally to visual threat with blink reflex, but when I announce that I will check his blind reflex, he keeps his eyes very wide open and does not blink at all.  His neck is supple.  Motor Exam:  Mainly giveaway weakness with ______ strength, the patient keeps his fingers strongly extended against resistance while explaining that he is too weak to flex them.  He keeps his left weak leg stiffly extended when trying to sit up.  He keeps his left arm pressed to his mattress of the examining table against my attempts to elevate it.  Deep tendon reflexes are 1+.  There is no clonus and no Babinski.  The ______ test shows wide tremulous movements beginning shortly before the patient reaches the face.  Gait had to be deferred as the patient refused.  LABORATORY DATA:  Review of MRI and MRA while the patient is in the MRI suite showed no acute stroke by perfusion or diffusion image.  After gadolinium was given, there was no evidence of metastatic disease to the brain.  The inferior branch of the middle cerebral artery seems to break off on the right side with decreased distal perfusion.  There is also diffuse arteriosclerosis mostly in the posterior perfusion area and significant valvular artery stenosis is seen.  IMPRESSION: 1. Non-organic functional exam of neurologic system.  There is clear    embellishment and inconsistencies. 2. Abdominal tenderness.  Might need to be looked at.  I am not surprised that     multiple organ  systems are positive on the review of systems and would    strongly caution against more extensive work-up and perhaps ultrasound or    barium swallow. 3. I would consider an EEG only if the family feels strongly about needing    more proof of the functional diagnosis, which I already suggested to the    wife.  I might add that the patients spouse is rather relieved that this    might be a a type of conversion disorder and I explained to her that this    is significantly different from malingering. 4. I strongly recommended a psychiatric consult for above. 5. The patient took Wellbutrin and Lexapro at home which was not on his    admission orders here.  Please consider a psychiatric consultation as the    SSRI discontinuation might provoke withdrawal responses. Dictated by:   Melvyn Novas, M.D. Attending Physician:  Doneta Public DD:  01/28/02 TD:  01/29/02 Job: [id number]

## 2011-01-18 NOTE — Discharge Summary (Signed)
NAME:  Carlos [name], Carlos [name]                        ACCOUNT NO.:  [id number]   MEDICAL RECORD NO.:  [id number]                   PATIENT TYPE:  OIB   LOCATION:  4709                                 FACILITY:  [facility]   PHYSICIAN:  Veneda Melter, M.D.                   DATE OF BIRTH:  September 20, 1950   DATE OF ADMISSION:  06/07/2003  DATE OF DISCHARGE:  06/08/2003                           DISCHARGE SUMMARY - REFERRING   DISCHARGE DIAGNOSES:  1. Peripheral vascular disease, status post percutaneous transluminal     coronary angioplasty/stent to the right common iliac.  2. Known coronary artery disease.  3. Hyperlipidemia, treated.  4. History of transient ischemic attacks.  5. Hypertension, treated.  6. History of hypothyroidism.  7. Gastroesophageal reflux disease.  8. Tobacco abuse.  9. Cholelithiasis.  10.      Chronic obstructive pulmonary disease.   HOSPITAL COURSE:  [name] is a 60 year old male patient who has a  history of nonobstructive coronary artery disease by catheterization in  2002.  He also has a history of progressive peripheral vascular disease and  ABIs were 0.72 on the right 0.83 on the left.  He was admitted on June 07, 2003 for an abdominal aortogram and bilateral lower extremity angiogram.  He  was found to have a right common iliac 95% stenosis in this area and he  underwent a PTCA/stent, reducing the lesion to a 0% lesion post procedure (7  x 29 Genesis).  The patient was hydrated overnight and was prepared for  discharge to home the following day without any problems.  At this point, we  are awaiting a BMET to return and as long as this is normal, he will be  discharged to home.   DISCHARGE INSTRUCTIONS:   MEDICATIONS:  1. Plavix 75 mg a day for six months.  2. Enteric-coated aspirin 325 mg a day.  3. He is to continue his current home medications which include:     a. Hydrochlorothiazide 12.5 mg a day.     b. Levoxyl 50 mcg daily.     c. Klonopin 0.5  mg daily.     d. Bupropion 75 mg two tablets b.i.d.     e. __________ 500/20 two tablets daily.     f. Protonix 40 mg a day.     g. Imdur 30 mg a day.     h. Advair 100/50 mcg one inhalation b.i.d.        a. Trazodone 100 mg two tablets nightly.     i. Sublingual nitroglycerin as needed for (p.r.n.) chest pain.  4  The patient may utilize Tylenol one to two tablets every (q) 6 hours  p.r.n. pain.   ACTIVITY:  No strenuous activity or driving for two to three days and may  gradually increase activity.   DIET:  Remain on a low-fat diet.   WOUND CARE:  Clean over puncture site  with soap and water, no scrubbing.  Call for any concerns, questions or recurrent pain.   FOLLOWUP:  The patient will have a re-study (ABIs) on June 30, 2003 and  follow up with Dr. Veneda Melter on July 04, 2003 at 11:45 a.m.  In  addition, he needs to make a followup appointment with Dr. Nilda Simmer, his  primary care physician at [facility], in four to six weeks.      Guy Franco, P.A. [facility]                      Veneda Melter, M.D.    LB/MEDQ  D:  06/08/2003  T:  06/08/2003  Job:  [id number]   cc:   Veneda Melter, M.D.   Nilda Simmer, M.D.  Family Medicine Resident [id number]  Fax: [phone]

## 2011-01-18 NOTE — Discharge Summary (Signed)
NAME:  Carlos [name], Carlos [name]                        ACCOUNT NO.:  [id number]   MEDICAL RECORD NO.:  [id number]                   PATIENT TYPE:  OIB   LOCATION:  6531                                 FACILITY:  [facility]   PHYSICIAN:  Veneda Melter, M.D.                   DATE OF BIRTH:  07-Dec-1950   DATE OF ADMISSION:  05/05/2003  DATE OF DISCHARGE:  05/06/2003                           DISCHARGE SUMMARY - REFERRING   SUMMARY OF HISTORY:  Carlos [name] is a 60 year old white male who presented  the [facility] cardiology office for followup visit. He was complaining of mild  shortness of breath and trace lower extremity edema and a daily rash that  occurred at 4 p.m. which he has attributed to the increase in HCTZ. He has  been recently treated for heart failure with increase of his Lasix; however,  this is not improved, thus his presentation. At the time of the presentation  in the office, he admitted to still smoking approximately 11 cigarettes per  day, and labs also showed an increase of his creatinine to 2.0 and a  potassium of 3.3. The patient's history is also notable for COPD with a FEV1  of 38%, hypertension, peripheral vascular disease, TIA with long term memory  deficit, hypertension, hyperlipidemia, GERD, hypothyroidism, depression,  obesity.   LABORATORY DATA:  EKG on September 2 showed sinus bradycardia, left axis  deviation, nonspecific ST-T wave changes. There is no preadmission  laboratory values on the chart. Postprocedure H and H was 13.0 and 38.3,  platelets 182, WBC 8.0. Sodium 136, potassium 3.6, BUN 12, creatinine 1.3,  glucose 118.   HOSPITAL COURSE:  Carlos [name] was brought in for cardiac catheterization.  Catheterization was performed on May 05, 2003 by Dr. Chales Abrahams. This shows  an EF of 55%, 60 to 70% diagonal 1, 40% AV circumflex, 60% proximal ramus, a  95% proximal mid RCA, a 40 to 50% PDA. He also has a 100% right iliac  occlusion. Dr. Chales Abrahams after reviewing performed  Cypher stenting to the  proximal mid RCA, reducing this to approximately 0% without difficulty. Dr.  Chales Abrahams felt that he should undergo staged intervention to his right iliac.  Post sheath removal and bedrest, the patient was ambulating without  difficulty. Catheterization site did have some slight ecchymosis but without  bruit or hematoma. After ambulation, he was seen by cardiac rehab and  smoking cessation, and Dr. Chales Abrahams after review felt the patient could be  discharged home.   DISCHARGE DIAGNOSES:  1. Shortness of breath.  2. Lower extremity edema.  3. Progressive coronary artery disease status post Cypher stenting to the     proximal RCA.  4. Continued tobacco use.  5. Recent renal insufficiency contributed to medications.  6. Hypokalemia.  7. Peripheral vascular disease.  8. History of hypertension.  9. Hyperlipoidemia.  10.      Transient ischemic attack.  11.  Gastroesophageal reflux disease.  12.      Hypothyroidism.  13.      Depression.   DISPOSITION:  Just prior to discharge, his home medications were reviewed.  He was asked to continue these. These include:  Plavix 75 mg q.d., aspirin  81 mg q.d., Advair 100/50 one puff b.i.d., trazodone 100 mg q.h.s., Imdur 30  mg q.d., Protonix 40 mg q.d., Klonopin 0.5 q.d., Wellbutrin 75 b.i.d.,  hydrochlorothiazide 12.5 q.d., Levoxyl 25 mg q.d., nitroglycerin as needed,  and Advicor 500/20 two tablets daily. He was advised no lifting, driving,  sexual activity or heavy exertion for two days. Maintain low salt, fat, and  cholesterol diet. If he has any problems with his catheterization site, he  was asked to call. He was advised no smoking or tobacco products. He will  follow up with Dr. Lynden Oxford. on September 17 at 12 p.m. At that time, a  BMP will be drawn. At the time of followup with Dr. Urban Gibson physician  assistant, arrangement and paperwork will be confirmed for patient's  readmission to short stay to undergo  percutaneous angioplasty to his right  iliac, as findings noted on cardiac catheterization listed above. Cardiac  risk factor modification should also be reviewed and reinforced.      Joellyn Rued, P.A. [facility]                    Veneda Melter, M.D.    EW/MEDQ  D:  05/06/2003  T:  05/06/2003  Job:  [id number]   cc:   Joni Fears D. Young, M.D.  [address] [place]  [place] [address]  Fax: [phone]   Venita Lick. Russella Dar, M.D. [facility]   Nilda Simmer, M.D.  Family Medicine Resident [id number]  Fax: [phone]

## 2011-01-18 NOTE — Discharge Summary (Signed)
Carlos [name], Carlos [name]              ACCOUNT NO.:  [id number]   MEDICAL RECORD NO.:  [id number]          PATIENT TYPE:  INP   LOCATION:  3743                         FACILITY:  [facility]   PHYSICIAN:  Pramod P. Pearlean Brownie, MD    DATE OF BIRTH:  [birth year]   DATE OF ADMISSION:  04/21/2006  DATE OF DISCHARGE:  04/25/2006                         DISCHARGE SUMMARY - REFERRING   ADMISSION DIAGNOSIS:  Code stroke.   DISCHARGE DIAGNOSES:  1. 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  2. Hypertension.  3. Hyperlipidemia.  4. Coronary artery disease.   HOSPITAL COURSE:  Carlos [name] is a 60 year old Caucasian male who was  admitted for evaluation for sudden onset of confusion and quadriparesis  while visiting his daughter.  He was found to be slumped in a chair with  weakness in all four limbs, difficulty with speaking, not recognizing  family members.  A code stroke was activated.  Patient was found to be  stable with vital signs.  He was slow to respond to questions.  He was  not moving all four extremities well.  His NIH stroke scale on admission  was 23.  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  There was mild calcification of the left subclavian  arteries, 50% stenosis.  Patient subsequently had an MRI scan which did  not reveal any acute stroke.  His cholesterol was 133, triglycerides  105, HDL 27, LDL 85.  Hemoglobin A1c was 6.5.  Random glucose 173.  Patient subsequently showed improvement.  He had some decreased  subjective movement on the left on exam but he had a very poor effort  which was quite variable and  patient was easily distracted.  He had  subjective decreased sensation on the left and exclude the midline.  Hence, his exam was thought to have nonorganic features and psychiatry  was consulted. He was seen by Dr. Antonietta Breach, psychiatrist, who  diagnosed the patient with mood disorder not otherwise specified,  anxiety and somatoform disorder.  He recommended patient stay on  Wellbutrin 150 twice a day augmented with Cymbalta 40 mg daily and  continue Klonopin at night for antianxiety and acute insomnia.  Patient's carotid ultrasound showed only mild __________ low range of  scan bilaterally.  A 2-D echo showed normal ejection fraction.  He was  seen by physical therapy and found to be able to ambulate safely.  He  was discharged home and advised to follow up with psychiatry as an  outpatient.   DISCHARGE MEDICATIONS:  1. Wellbutrin 75 twice a day.  2. Trazodone 100 once a day.  3. Levothyroxine 25 mcg daily.  4. Spiriva HandiHaler p.r.n.  5. Clonazepam p.r.n.  6. Atrovent p.r.n.  7. Plavix 75 mg a day.  8. Imdur 60 mg a day.  9. Lasix 20 mg a day.  10.Zocor 40 mg a day.  11.Protonix 40 mg a day.  12.Cymbalta 20 mg twice a day.           ______________________________  Sunny Schlein. Pearlean Brownie, MD     PPS/MEDQ  D:  08/05/2006  T:  08/05/2006  Job:  [id number]

## 2011-01-18 NOTE — Consult Note (Signed)
NAMELAZAR, TIERCE              ACCOUNT NO.:  [id number]   MEDICAL RECORD NO.:  [id number]          PATIENT TYPE:  INP   LOCATION:  3743                         FACILITY:  [facility]   PHYSICIAN:  Antonietta Breach, M.D.  DATE OF BIRTH:  05-Nov-1950   DATE OF CONSULTATION:  04/24/2006  DATE OF DISCHARGE:                                   CONSULTATION   The reason for the stat dictation is due to facilitating disposition.   Mr. Fredericks continues to have somatic weakness; however, his interests are  intact at about 70%.  His thought process is coherent.  He has no thoughts  of harming himself or others.  He has no delusions or hallucinations.  He is  not having any adverse medication effects.   VITAL SIGNS:  Temperature 97.8, pulse 57, respirations 20, blood pressure  159/84, O2 saturation on 2 liters is 96%.   LABORATORY DATA:  The basic metabolic panel on August 22, was unremarkable.  Calcium was 8.4.   MENTAL STATUS EXAMINATION:  Mr. Griess is alert.  He is oriented to all  spheres.  His affect is constricted.  His thought process is logical,  coherent, and goal directed.  No looseness of association.  Thought content:  No thoughts of harming himself.  No thoughts of harming others.  No  delusions.  No hallucinations.  Memory is intact to immediate, recent, and  remote.  Concentration is mildly decreased; however, judgment is intact.  Insight is partial.   ASSESSMENT:  AXIS I:  1. Mood disorder, not otherwise specified, 293.83, depressed (functional      and general medical factors).  2. Anxiety disorder, not otherwise specified, 293.84.  3. Somatoform disorder, not otherwise specified.   Mr. Fairbank is not at risk to harm himself or others.  He agrees to notify  staff or emergency services for thoughts of harming himself, thoughts of  harming others or distress.   Concerning Mr. Mitchener's discharge disposition, efforts can be made to have  Mr. Strnad admitted to a  psychiatric facility on the grounds that his  somatoform condition does prevent him from being able to self-care and  perform his activities of daily living.  However, if this is not obtainable,  then an institutional form of living such as assisted living facility could  be utilized while scheduling the patient for regular outpatient  psychotherapy and psychotropic medication adjustment.  Some facilities have  transportation to and from appointments.   RECOMMENDATIONS:  1. Discharge planning:  As above.  2. Continue Wellbutrin 150 mg p.o. b.i.d. augmented with Cymbalta 40 mg      daily for Mr. Winters's      antidepressant trial, continue Klonopin 2 mg nightly for antianxiety      and anti-insomnia with a goal of eventual elimination as his other      medication and psychotherapy become effective.  Utilize Desyrel 50-100      mg nightly for insomnia.      Antonietta Breach, M.D.  Electronically Signed     JW/MEDQ  D:  04/25/2006  T:  04/25/2006  Job:  [id number] 

## 2011-01-18 NOTE — Discharge Summary (Signed)
[place]. [facility]  Patient:    OVERTON, BOGGUS Visit Number: [id number] MRN: [id number]          Service Type: MED Location: [phone] Attending Physician:  Willow Ora Dictated by:   Lucille Passy, M.D. Admit Date:  07/06/2001 Discharge Date: 07/07/2001   CC:         Onalee Hua L. Reed Breech, M.D., [facility]   Discharge Summary  DATE OF BIRTH: 02/03/1951  DISCHARGE DIAGNOSES:  1. Transient ischemic attack.  2. Tobacco abuse.  3. Hypertension.  4. Chronic obstructive pulmonary disease.  5. Anxiety disorder.  6. Hypothyroidism.  7. Hyperlipidemia.  5. Major depression.  6. History of cerebrovascular accident in February 2002.  7. History of gastric ulcer.  PROCEDURES:  1. Chest x-ray on July 06, 2001.  2. MRI/MRA on July 06, 2001.  3. Electroencephalogram on July 07, 2001.  CONSULTATIONS: Neurology (Dr. Sandria Manly).  DISCHARGE MEDICATIONS:  1. The patient was advised to discontinue Ativan and to start taking     Valium 5 mg p.o. q.8h p.r.n. anxiety/insomnia.  2. The patient was also given a lower dosage Duragesic patch at 25 mcg     q.72h.  3. Nicotine patch 14 mg q.d.  4. The patient was told to continue his regular home medicines, which are as     follows: Advair Diskus, albuterol p.r.n., aspirin, Plavix, Atrovent,     Atacand, Levoxyl, MiraLax, Protonix, Senokot-S, Zocor, Zoloft, trazodone.  DISCHARGE INSTRUCTIONS: Activity as tolerated.  Low-cholesterol/low-salt diet.  FOLLOW-UP: Follow up with Dr. Reed Breech at the family practice center on July 20, 2001 at 1:30 p.m.  HISTORY OF PRESENT ILLNESS: Mr. Staffieri is a 60 year old Caucasian male, whose primary doctor is Dr. Reed Breech at the family practice center, who has a history of a right-sided stroke in February 2002.  The patient presented the day of admission with a three day history of left-sided weakness, blurred vision in the left eye, and  intermittent headaches.  His symptoms were worsening in severity, so he came to the ED for evaluation.  Of note, the patient also reported worsening depression and anxiety recently.  He has an extensive past medical history and is on an extensive medicine list.  PAST MEDICAL HISTORY: Significant for lung cancer, hypertension, previous CVA, COPD, anxiety, depression.  HOSPITAL COURSE: PROBLEM #1 - TRANSIENT ISCHEMIC ATTACK: On initial examination the patient did have slightly decreased strength on the left side, with strength 4/5 on the left and 5/5 on the right, with deep tendon reflexes 1+ on the left and 2+ on the right, as well as subjective decreased sensation on the left.  Otherwise, his neurologic examination was intact.  The patient does have a history of a prior CVA in February 2002 which caused a left hemisensory deficit.  During that episode the patient was found on MRI to have a right-sided thalamic stroke.  The patient reports that this left hemisensory deficit has essentially resolved and he only has residual effects on a rare intermittent basis.  During the current admission the patient had a normal noncontrast head CT, and an MRI which showed no evidence of infarct or abnormal enhancing lesions.  The MRI only showed mild nonspecific white matter changes and an old small lacunar infarct in the basal ganglia.  An MRA on the day following admission showed diminutive vertebral arteries and basilar artery with moderate arteriosclerotic type change noted involving branch vessels along both the anterior and posterior circulation bilaterally, but  no significant blockage was seen.  The patient had normal carotid Dopplers on his previous admission in February 2002, so this study was not repeated.  The patient also had an echocardiogram during his previous admission which showed an ejection fraction of 40-505% with mild left atrial enlargement and mild right atrial enlargement, and  mild AV thickening, so this study was not repeated, either. By the day after admission the patients symptoms of left-sided weakness had completely resolved, with strength 5/5 throughout and deep tendon reflexes 2+ throughout.  His neurologic examination was nonfocal.  No left-sided visual field deficits were found on examination, and the patient felt ready for discharge.  The patient came in on aspirin and Plavix.  A neurology consult was obtained with Dr. Sandria Manly.  They ordered an EEG, which was pending at the time of this dictation.  There was no mention in their consult note of suggestion to start the patient on Coumadin.  The patient is already on aspirin and Plavix, but the patients primary doctor may wish to change him to aspirin and Coumadin later on.  The patient had an EKG on admission that showed normal sinus rhythm with a rate of 68 and first degree AV block but otherwise normal.  The patient had no evidence on telemetry during hospitalization.  PROBLEM #2 - TOBACCO ABUSE: The patient is very interested in stopping smoking.  He was given a prescription for a nicotine patch with the caution that he must not smoke while he is using this patch.  He understood this very well.  He states that he will try this and can continue this patch per his primary doctor if he wishes.  PROBLEM #3 - HYPERTENSION: The patients blood pressure was well controlled during hospitalization on Atacand.  PROBLEM #4 - CHRONIC OBSTRUCTIVE PULMONARY DISEASE: The patient had excellent O2 saturations at 96% on room air on the day of discharge, and his chest x-ray was stable with no sign of infiltrate or acute disease.  The patient will be discharged on his home regimen of Advair Diskus, albuterol, Atrovent.  PROBLEM #5 - ANXIETY DISORDER: Dr. Reed Breech had previously switched the patient from Valium to Ativan.  However, the patient states the Ativan simply does not work for him and requested to be switched back  to Valium.  Along these lines,  the patient will be restarted on 5 mg of Ativan q.8h p.r.n. for anxiety and to help him sleep.  PROBLEM #6 - HYPOTHYROIDISM: Stable during hospitalization.  PROBLEM #7 - HYPERLIPIDEMIA: Continue Zocor.  PROBLEM #8 - MAJOR DEPRESSION: The patient was previously on Paxil but was changed to Zoloft.  The patient and his family feel that he had better relief from depression on Paxil and would like to be switched to this.  However, as he will need to be tapered off Zoloft he will be discharged on Zoloft and he can be switched on an outpatient basis by his primary doctor.  INSTRUCTIONS FOR PATIENTS PRIMARY DOCTOR:  1. Please note that the patient requested to be switched from Ativan back to     Valium as he feels that his gives him much better relief from his anxiety.     If his Valium is discontinued in the future it should be slowly tapered     down.  2. Please note that the patient was given a prescription for a nicotine patch     at 14 mg q.d. to help him with smoking cessation.  He was given a  prescription for enough patches to last until his follow-up visit with Dr.     Reed Breech.  3. Please note that the patient very much wishes to come off of his Duragesic     patch.  He was using a 50 mcg Duragesic patch at home but had decided to     discontinue this patch at home.  Since he should be tapered off of this     patch, he will be discharged on a 25 mcg patch and this patch can be     slowly tapered down with oral medicines by his primary care doctor.  4. Please note that an EEG was pending at the time of discharge. Dictated by:   Lucille Passy, M.D. Attending Physician:  Willow Ora DD:  07/07/01 TD:  07/09/01 Job: [id number]

## 2011-01-18 NOTE — Procedures (Signed)
[place]. [facility]  Patient:    Carlos [name], Carlos [name]                     MRN: [id number] Proc. Date: 01/16/01 Adm. Date:  [id number] Disc. Date: [id number] Attending:  Young, Copy CC:         Clinton D. Maple Hudson, M.D.  Eduardo Osier. Sharyn Lull, M.D.   Procedure Report  DATE OF BIRTH:  02-Dec-1950.  PROCEDURE:  Colonoscopy, changed to flexible sigmoidoscopy.  ENDOSCOPIST:  Anselmo Rod, M.D.  INSTRUMENT USED:  Olympus video colonoscope.  INDICATION FOR PROCEDURE:  Rectal bleeding in a 60 year old white male with a history of lung lesion recently bronchoscoped by Dr. Joni Fears D. Young. Patient has severe emphysema.  Patient also has a history of alcohol abuse. Patient has had a recent CT scan that shows a questionable polyp versus a lesion in the ascending colon.  PREPROCEDURE PREPARATION:  Informed consent was procured from the patient. The patient was fasted for eight hours prior to the procedure and prepped with a bottle of magnesium citrate, two Fleets enemas, and a gallon of GoLYTELY (he consumed three-fourths of a gallon of GoLYTELY) the night prior to the procedure.  PREPROCEDURE PHYSICAL:  VITAL SIGNS:  The patient had stable vital signs.  NECK:  Supple.  CHEST:  Decreased breath sounds.  S1, S2 regular.  ABDOMEN:  Soft with normal bowel sounds.  DESCRIPTION OF PROCEDURE:  The patient was placed in the left lateral decubitus position and sedated with 40 mg of Demerol and 3 mg of Versed intravenously.  Once the patient was adequately sedate and maintained on low-flow oxygen and continuous cardiac monitoring, the Olympus video colonoscope was advanced from the rectum to 40 cm with extreme difficulty. There was a large amount of liquid and solid stool in the colon, and thus visualization was not adequate and the procedure had to be aborted at this point with plans to re-prep the patient and re-scope him in the near  future.  IMPRESSION:  Inadequate prep, procedure aborted.  RECOMMENDATIONS:  Repeat colonoscopy will be scheduled for the patient and a different prep will be used.  This will be done on an outpatient basis in the next week or two. DD:  01/16/01 TD:  01/19/01 Job: [id number]

## 2011-01-18 NOTE — Discharge Summary (Signed)
NAMEGLEN, KESINGER              ACCOUNT NO.:  [id number]   MEDICAL RECORD NO.:  [id number]          PATIENT TYPE:  INP   LOCATION:  2038                         FACILITY:  [facility]   PHYSICIAN:  Jonelle Sidle, M.D. LHCDATE OF BIRTH:  [birth year]   DATE OF ADMISSION:  11/23/2005  DATE OF DISCHARGE:  12/10/2005                                 DISCHARGE SUMMARY   PRIMARY CARDIOLOGIST:  Dr. Simona Huh.   PULMONOLOGY:  Dr. Delton Coombes.   NEUROLOGY:  Dr. Orlin Hilding.   PRINCIPAL DIAGNOSIS:  Severe chronic obstructive pulmonary disease.   OTHER DIAGNOSES:  1.  Chest pain/coronary artery disease.  2.  Peripheral vascular disease status post stenting of the right iliac      artery in 2004.  3.  History of stroke with recurrent transient ischemic attacks.  4.  Cerebrovascular disease.  5.  Hypertension.  6.  Hyperlipidemia.  7.  Anxiety.  8.  Depression.  9.  Chronic pain.  10. Insomnia.  11. Ongoing tobacco abuse.  12. History of lung cancer status post resection and bulla resection.  13. History of gastroesophageal reflux disease.  14. Hypothyroidism.  15. Gallstones.  16. Gait disturbance.   ALLERGIES:  PENICILLIN AND SULFA.   PROCEDURES:  1.  Left heart cardiac catheterization.  2.  MRI/MRA of the head and neck vessels.  3.  Cerebral angiography.  4.  MRI of the cervical spine.  5.  Abdominal ultrasound.  6.  Chest CT.   HISTORY OF PRESENT ILLNESS:  A 60 year old married male with prior history  as outlined above including coronary artery disease status post stenting of  the right coronary artery with a Cypher drug eluting stent in 2004.  He was  recently evaluated by Dr. Diona Browner in the office for complaints of  increasing shortness of breath and chest pain with exertion, and was  scheduled for outpatient cardiac catheterization to take place November 26, 2005.  However, in the interim, the patient developed recurrent chest  discomfort and dyspnea, and presented to the  emergency room on November 23, 2005.  He had multiple other complaints including cough, which had become  productive with copious amounts of sputum.  The decision was made to admit  him for further evaluation.   HOSPITAL COURSE:  The patient ruled out for MI and a decision was made to  pursue cardiac catheterization.  Catheterization was placed on hold  secondary to concern over a possible TIA when the patient was found to be  staring blankly by his wife and daughter times several minutes without  response.  Neurology was consulted, and recommended an MRI of the brain with  MRA of the head and neck vessels, as well as an EEG.  Testing was performed  revealing a normal EEG and the MRI was negative for acute stroke.  MRA  showed a 50% left vertebral artery stenosis.  He also underwent a chest CT  secondary to his presentation with chest pain and this was negative for PE,  but did show severe COPD.  As a result, Pulmonology was also consulted and  made  recommendations for antibiotics, nebulizer therapy and a  discontinuation of his steroids.  He finally underwent left heart cardiac  catheterization on March 29, which showed nonobstructive coronary artery  disease with an EF of 60% with mild inferior hypokinesis.  Subsequent to  this, he was taken to the interventional radiology lab where he underwent  cerebral angiography revealing an occluded left vertebral artery proximally  with mild distal reconstitution and complete angiographic occlusion at the  cranial skull base.  There were also arterial sclerotic changes involving  the mid-basal artery distal to the origin of the anterior and inferior  cerebellar arteries.  There was also mild atherosclerotic disease involving  the carotid bulge without acute ulcerations.  From a cardiac standpoint, he  was felt to be ready for discharge.  However, he continued to have ongoing  pulmonary issues including hemoptysis for which his Plavix was held for  1  week prior to be reinitiated, and recurrent wheezing resulting in  reinitiation of prednisone taper.  After a long discussion with his wife, it  was decided that the burden of care that he presented for her at this time  was too great and that he would likely benefit from rehabilitation therapy  following discharge.  Case management and social work were involved and  began looking into skilled nursing facilities locally.  In the interim, Mr.  Allbaugh worked with physical therapy and occupational therapy, and was felt  to have quite an unsteady gait.  Neurology was reconsulted and they felt  that this was likely organic in nature, and he was initiated on Neurontin  therapy, which has been titrated to 600 mg t.i.d.  He has completed his  course of Avelox and placement has been arranged.  He is being discharged to  rehabilitation today in satisfactory condition.   DISCHARGE LABORATORY:  Hemoglobin 12.5, hematocrit 36.1, WBC 11.8, platelets  294 and MCV 85.  Sodium 139, potassium 4.3, chloride 106, CO2 28, BUN 11,  creatinine 1.1, glucose 116 and calcium 8.9.   DISPOSITION:  The patient is being discharged home today in good condition.   FOLLOW UP PLANS AND APPOINTMENTS:  He is asked to follow up with Dr. Nona Dell in the cardiology clinic in approximately 3 months.  He is asked to  follow up with pulmonary, Dr. Delton Coombes or Dr. Sherene Sires, who he has apparently seen  in the past, in 3 weeks.  He also has follow up with Dr. Orlin Hilding with  neurology in 3-4 weeks.   DISCHARGE MEDICATIONS:  1.  Aspirin 81 mg daily.  2.  Trazodone 100 mg q.h.s.  3.  Wellbutrin 150 mg b.i.d.  4.  Isosorbide mononitrate 60 mg daily.  5.  Protonix 40 mg daily.  6.  Synthroid 100 mcg daily.  7.  Cymbalta 20 mg daily.  8.  Lipitor 40 mg daily.  9.  Prednisone taper 10 mg daily x2 additional days, then 5 mg daily x3 days      and then discontinue.  10. Cozaar 25 mg daily. 11. Plavix 75 mg daily.  12.  Neurontin 600 mg t.i.d.  13. Colace 100 mg b.i.d.  14. Atrovent inhaler q.6h.  15. Xopenex inhaler q.6h.   OUTSTANDING LABORATORY STUDIES:  None.   DURATION OF DISCHARGE ENCOUNTER:  Sixty minutes including physician time.      Ok Anis, NP    ______________________________  Jonelle Sidle, M.D. [facility]    CRB/MEDQ  D:  12/10/2005  T:  12/10/2005  Job:  [id number]   cc:   Gustavus Messing. Orlin Hilding, M.D.  Fax: [phone]   Leslye Peer, M.D.

## 2011-01-18 NOTE — Consult Note (Signed)
[place]. [facility]  Patient:    Carlos [name], Carlos [name] Visit Number: [id number] MRN: [id number]          Service Type: MED Location: [phone] Attending Physician:  Willow Ora Dictated by:   Genene Churn. Love, M.D. Proc. Date: 07/07/01 Admit Date:  07/06/2001 Discharge Date: 07/07/2001   CC:         Camille L. Mardelle Matte, M.D.   Consultation Report  PATIENTS ADDRESS:  [address] [place], [place] [address].  DATE OF BIRTH:  1951/02/13  REASON FOR CONSULTATION:  This 60 year old, right-handed, white married male is seen for evaluation of recurrent left-sided weakness and heaviness.  HISTORY OF PRESENT ILLNESS:  Carlos [name] has known history for the last 10 months of hypertension and presented in February of 2002 with a right brain subcortical ischemic stroke. He was found subsequently this summer of 2002 to have evidence of small cell carcinoma and underwent lung resection at [facility] in August of 2002. He now presents with a one-week history of vague left-sided arm and leg heaviness and numbness. Friday, he was at his physical therapy and noted left-sided weakness, was able to drive home, was noted to have left-sided weakness and blurred vision. The patient believes that his symptomatology is related to anxiety and depression because of psychosocial problems at home. He has been on aspirin and Plavix. He denies any nausea and vomiting, headache, double vision, or swallowing problems with this. He has been very anxious about his home interactions.  MEDICATIONS AT HOME:  1. Advair Diskus 250 subcutaneous b.i.d.  2. Albuterol p.r.n.  3. Aspirin 325 mg q.d.  4. Adagen.  5. Hydrochlorothiazide 32/12.5 q.d.  6. Atrovent q.6h.  7. Duragesic patch 50 mcg per hour.  8. Levoxyl 100 mcg q.d.  9. Plavix 75 mg q.d. 10. Protonix 40 mg q.d. 11. Senokot two tablets q.h.s. 12. Valium 5 mg q.h.s. 13. Trazodone 100 mg q.h.s. 14. Zocor 20 mg q.d. 15.  Zoloft 50 mg q.d.  PHYSICAL EXAMINATION:  GENERAL:  Well-developed, white male.  VITAL SIGNS:  Blood pressure in the right and left arm 150/80, heart rate was 68 and regular. There was no change in blood pressure going from the lying to the standing position.  NECK:  There was a left and right supraclavicular bruit heard.  HEART:  He had aortic murmur.  NEUROLOGICAL:  Mental status:  He was alert and oriented x 3. Followed one, two, and three step commands. Cranial nerve examination revealed visual fields full. Disks flat. Extraocular movements were full. Corneals were present and facial sensation equal. No seventh nerve palsy present. Hearing present.  Air conduction greater than bone conduction. Tongue was midline. The uvula was midline. Gags were present. Sternocleidomastoid and trapezius testing were normal. Motor exam:  Revealed 5/5 strength in the upper extremities, poor heel-to-shin with the left heel to the right. Sensory examination:  Intact to pinprick, light touch, position, and vibration testing. Deep tendon reflexes 2+ and plantar responses were downgoing. Gait steady.  LABORATORY DATA:  MRI showed evidence of old small vessel ischemic disease bilaterally. No evidence of a brainstem stroke. He did, however, have a small left vertebral and small left basilar artery. There was no evidence of metastatic lesion present on the enhanced study.  IMPRESSION:  1. Suspect right brain transient ischemic attack, code 435.9.  2. Hypertension, code 796.2.  3. 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  4. Chronic obstructive pulmonary disease, code 496.  5. Lung  cancer, code 162.9.  6. Anxiety, code 300.00.  7. Depression, code 311.  8. History of peripheral vascular disease.  PLAN:  Plan at this time is to consider addition of Mega-B. An EEG will be obtained for further evaluation. Dictated by:   Genene Churn. Love, M.D. Attending Physician:  Willow Ora DD:  07/07/01 TD:   07/08/01 Job: [id number]

## 2011-01-18 NOTE — Discharge Summary (Signed)
[place]  Patient:    Carlos [name], Carlos [name]                     MRN: [id number] Adm. Date:  [id number] Disc. Date: 11/14/00 Attending:  Erich Montane CC:         [facility], [address]  [place] D. Maple Hudson, M.D.   Discharge Summary  ADMISSION DIAGNOSIS:  Onset of slurred speech, rule out recurrent stroke.  DISCHARGE DIAGNOSES: 1. History of right brain stroke within the last month.  No recurrent    stroke this admission. 2. Bullous emphysema. 3. Ongoing tobacco abuse. 4. Hypertension. 5. Depression. 6. Coronary artery disease.  PROCEDURES DURING THIS ADMISSION: 1. CT scan of the head. 2. MRI scan of the brain.  COMPLICATIONS TO ABOVE PROCEDURES:  None.  HISTORY OF PRESENT ILLNESS:  Carlos [name] is a 60 year old right handed white male born 1951/06/07 with a history of cerebrovascular disease. The patient has history of hypertension, presented initially to [facility] on October 08, 2000 with a left hemisensory deficit.  The patient was discovered to have a subcortical stroke in the right brain that correlated with his deficit.  The patient was treated with antiplatelet agents.  During that admission; however, the patient had ongoing chest pain and was set up with a cardiology evaluation.  The patient was found to have some coronary artery disease and decision was made to treat the patient medically.  The patient had a nondominant right coronary artery totally occluded, 50 to 60% lesion in the diagonal vessel, significant peripheral vascular disease was noted, mild hypokinesis was noted of the heart wall was noted.  The patient has been seeing Dr. Jetty Duhamel as well for problems with his shortness of breath.  He was found to have severe bullous emphysema.  The patient has been on Advir inhaler but complains of increased shortness of breath over the last two weeks or so prior to this  admission.  The patient has also been complaining of problems with abnormal dreams, being somewhat child like, seeming depressed, crying spells.  The patient has been on Trazodone and Paxil as well as Diazepam.  The patient was admitted at this point for evaluation of a possible new stroke.  LABORATORY DATA:  Notable for CPK of 64, troponin I of 0.01.  INR 1.0.  White count 11.1, hemoglobin 13.7, hematocrit 39.7, MCV 83.4, platelets 331.  Sodium 139, potassium 4.0, chloride 103, BUN 14, glucose 74, creatinine 1.1. Urinalysis revealed specific gravity of 1.015, otherwise unremarkable.  Chest x-ray shows severe bullous emphysema.  Lower lobe atelectasis was seen.  EKG reveals normal sinus rhythm.  Septum infarct age undetermined, new since October 08, 2000.  Heart rate 78.  HOSPITAL COURSE:  The patient did fairly well during the course of hospitalization.  The patient was admitted for questionable slurred speech, worsening left sided deficits.  The patient was set up for an MRI scan of the brain that shows old right brain infarct. No new infarct seen.  The patient does have some underlying small vessel ischemic changes.  Cardiac enzymes were negative as above.  The patient has again been complaining of ongoing chest pain that seems to be chest wall pain with increased tenderness with pressing on the chest wall.  The patient also has a history of hiatal hernia which may be associated with some of the chest discomfort.  The patient does have some significant shortness of  breath with minimal effort.  Wife has requested that Dr. Jetty Duhamel see him during this hospitalization prior to discharge.  The patient has not been on Advir inhaler until the last day of admission.  The patient is felt to have some underlying depression and anxiety problems.  The episode of dizziness and slurred speech coming in very well could have been a panic attack.  DISCHARGE MEDICATIONS: 1. Trazodone 50 mg two  at night. 2. Plavix 75 mg a day. 3. Aspirin 325 mg a day. 4. Paxil 40 mg a day. 5. Diazepam 2 mg twice a day. 6. Protonix 40 mg one a day. 7. Atacand HCT 32/12.5 mg one a day. 8. Advir inhaler two puffs twice a day.  The patient is to have no strenuous activity.  He should be an adequate candidate for disability.  He is to have a no added salt diet.  The patient continues to smoke and was asked repeatedly to stop smoking.  The patient will follow up with [facility] with Dr. Shari Heritage in two weeks.  At the time of discharge, the patient is bright, alert, cooperative, fully ambulatory.  No drift seen.  Good strength in all fours. DD:  11/14/00 TD:  11/14/00 Job: [id number]

## 2011-01-18 NOTE — Discharge Summary (Signed)
[facility]  Patient:    Carlos [name], Carlos [name]                     MRN: [id number] Adm. Date:  [id number] Disc. Date: [id number] Attending:  Lesly Dukes CC:         Lacretia Leigh. Quintella Reichert, M.D.  Meade Maw, M.D.   Discharge Summary  ADMITTING DIAGNOSIS:  New onset of left hemisensory deficit, rule out thalamic stroke.  DISCHARGE DIAGNOSES:  1. Right thalamic stroke with left hemisensory deficit.  2. hypertension previously unrecognized.  3. History of chest pressure with negative coronary catheterization.  PROCEDURE:  1. CT scan of the brain.  2. MRI scan of the brain.  3. MR angiogram.  4. 2-D echocardiogram.  5. Carotid Doppler study.  6. Cardiac catheterization.  7. CT of the chest.  COMPLICATIONS:  None.  HISTORY OF PRESENT ILLNESS:  [name] is a 60 year old right handed white male born Dec 18, 1950 with a history of tobacco abuse and unrecognized and untreated hypertension. The patient had felt bad for a couple of days prior to this admission with the onset of left sided numbness and heaviness of the left leg that developed two days prior to admission. The patient noted numbness involving the left arm, leg, and left face and had some questionable slurred speech, slight headache. The patient denied any visual field changes or significant problems with balance. The patient had no loss of consciousness of confusion. CT of the head done through the emergency room showed some old bilateral lacunar type strokes. No acute process was seen. The patient was admitted for further evaluation.  PAST MEDICAL HISTORY:  1. 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  2. History of tobacco abuse.  3. History of hypertension untreated.  4. History of depression.  5. History of tonsillectomy in the past.  MEDICATIONS:  1. Paxil 20 mg a day.  2. Restoril 30 mg q.h.s.  ALLERGIES:   PENICILLIN.  Smokes a pack and a half of cigarettes a a day, does not drink alcohol.  Please refer to the history and physical dictation for summary of social history, family history, review of systems and physical examination.  LABORATORY DATA:  Notable for sodium of 140, potassium 4.6, chloride 105, CO2 32, glucose of 119, BUN of 13, creatinine 1.1, calcium 9.1, INR of 1.0. White count of 8.6, hemoglobin of 15.8, hematocrit 45.1, MCV of 82.8, platelets of 319. CPK of 63, calcium of 9.6, total protein 7.8, albumin 4.0, AST 13, ALT of 10, ALP of 70, total bilirubin 0.4.  Chest x-ray shows emphysema with apical bullae, questionable nodule in the right mid lung. CT scan of the chest is pending at this time. CT scan of the brain on admission shows no acute intracranial abnormalities with evidence of some remote lacunar infarctions in the left paraventricular deep white matter and posterior aspect of the right putamen.  HOSPITAL COURSE:  The patient has done relatively well during the course of hospitalization. The patient underwent an MRI scan of the brain that shows evidence of an acute infarction of the right thalamus, evidence of periventricular white matter changes, sinusitis involving the maxillosinuses and ethmoid sinuses bilaterally. MR angiogram shows evidence of intracranial atherosclerosis with extensive small vessel type changes, antegrade flow to the left vertebral artery, diminutive base artery with the dominant ______ bilaterally, some signal drop off in the left middle cerebral artery distribution.  The patient also underwent a 2-D echocardiogram showing evidence of ejection fraction 40-50%, aortic valve thickness was moderately increased, mild mitral annular calcifications seen, left atrium was mildly dilated, right atrium was mildly dilated. EKG revealed normal sinus rhythm, left anterior vesicular block, nonspecific T wave abnormalities seen, prolong3ed Q/T interval,  heart rate 76. The patient also underwent a carotid Doppler study that was unremarkable. The patient has been setup for homocystine level and lipid panel which is pending at this time. The patient was seen by cardiology during this hospitalization due to complaints of chest pressure. Workup did include coronary catheterization after a cardiolite study showed some questionable abnormalities. The cardiac catheterization is relatively unremarkable, however. The patient was felt to be a medical management case. Dr. Quintella Reichert has seen and has recommended chest CT for the possible nodule in the lung. This has been setup but has not yet been done. At this time, the patient is bright, alert, cooperative, fully ambulatory, normal strength, but still complains of some left sided sensory changes. The patient again is fully ambulatory and has been seen by physical and occupational therapy during this hospitalization. The plans are for discharge following the CT of the chest if the CT is unremarkable. Discharge medications will include:  DISCHARGE MEDICATIONS:  1. Enteric coated aspirin one a day.  2. Paxil 20 mg a day.  3. Catapres TTS-3 patch one a week.  4. Protonix 40 mg.  5. Norvasc 10 mg a day.  6. Imdur 30 mg daily.  FOLLOW-UP:  The patient will follow-up with Dr. Meade Maw from cardiology and in two weeks will be followed up with Dr. Quintella Reichert for management of blood pressure. Will be seen within a month by [facility] for follow-up for the stroke.  The patient is not to have any strenuous activity for at least three to four days following discharge and the patient will be increasing his ______ to ambulate. DD:  10/15/00 TD:  10/15/00 Job: [id number]

## 2011-01-18 NOTE — Op Note (Signed)
NAME:  Carlos [name], Carlos [name]                        ACCOUNT NO.:  [id number]   MEDICAL RECORD NO.:  [id number]                   PATIENT TYPE:  OIB   LOCATION:  4709                                 FACILITY:  [facility]   PHYSICIAN:  Veneda Melter, M.D.                   DATE OF BIRTH:  08-20-1951   DATE OF PROCEDURE:  06/07/2003  DATE OF DISCHARGE:                                 OPERATIVE REPORT   PROCEDURES PERFORMED:  1. Abdominal aortogram.  2. Bilateral lower extremity angiogram.  3. Percutaneous transluminal angioplasty and stent placement of the right     common iliac artery.   DIAGNOSES:  1. Peripheral vascular disease.  2. Claudication.   HISTORY OF PRESENT ILLNESS:  Carlos [name] is a 60 year old gentleman  with  advanced chronic obstructive pulmonary disease and tobacco use and known  coronary disease who has recently undergone percutaneous stent placement in  the right coronary artery. The patient had claudication at the time of  cardiac catheterization. He underwent abdominal  aortogram showing subtotal  narrowing of the right iliac artery. He  presents now for further  assessment. Recent  ankle-brachial indices were 0.72 on the right and 0.83  on the left.   DESCRIPTION OF PROCEDURE:  Informed consent was obtained. The patient was  brought to the peripheral vascular laboratory. A 6 French sheath was placed  in the right femoral artery using modified Seldinger technique.   Using an endhole catheter which was introduced in the right common iliac  artery, selective angiography was performed of the right common iliac  artery. This showed severe narrowing of 95% with possible evidence of  retrograde dissection, and the patient was given heparin systemically. He  had been pretreated with aspirin  and Plavix  and a 0.035 angle glide wire  was introduced. Despite multiple careful advancements we could not identify  the true lumen of the right iliac artery.   The left groin was  then sterilely prepped and draped and a 6 French sheath  was placed in the left femoral artery. A pigtail catheter was advanced  through the  abdominal  aorta and an abdominal  aortogram and bilateral  lower extremity angiogram was performed using power injection of contrast  and digital subtraction angiography and step-table technique.   This showed the aorta at the iliac bifurcation to be a small  caliber vessel  with moderate disease of 30%. The left iliac artery had mild diffuse disease  of 30%. There was what appeared  to be an accessory internal iliac artery.  The 2 left internal iliac arteries had moderate disease of 30% to 40%. The  distal common iliac and  external iliac arteries had mild disease of 30%.  The common femoral artery has moderate narrowing of 60% in its proximal  segment. The superficial femoral artery is patent with mild disease of 30%  in the proximal segment. The  popliteal artery has mild disease of 30% to 40%  as well. Three vessel runoff is noted distally.   The right iliac artery is a small  caliber vessel. There appears to be a  high-grade narrowing of at least 95% in the proximal segment. There is then  diffuse disease of 50% in the remainder of the vessel and some of  this may  be due to  shrinkage due to underfilling. The common femoral artery has mild  disease of 30%. The superficial femoral artery also has mild disease of 30%  and is patent along its entire course. The popliteal artery has mild  irregularities at the  trifurcation vessels. Three vessel runoff is noted to  the ankle.   We elected to proceed with intervention to the right iliac artery via the  left side, and a 6 [place]  IM guide catheter was introduced. The [facility] wire  was introduced and was easily advanced into the right iliac artery and  confirmed the angiography. Using a 4 French snare, the Wholey wire was  retracted through the right common femoral artery sheath and extended.   Intervention was then carried out through the right common femoral artery  using the IM guide catheter to confirm positioning of our equipment.   A 6 x 2 Powerflex balloon was introduced and used to predilate the proximal  right iliac artery at 6 atmospheres for 60 seconds. Repeat angiography  showed an excellent result with significant improvement in vessel lumen and  flow. There was residual dissection in the proximal segment of the vessel  and a 7 x 29 Genesis stent was introduced. This was carefully  positioned at  the origin of the right iliac artery and deployed at 10 atmospheres for 60  seconds. Repeat angiography  showed an excellent result with no residual  stenosis, full coverage of the lesion  and brisk flow distally. There was  evidence of a very small amount of contrast extravasation in the external  iliac, possibly due to wire perforation, however this appeared contained.   The IM guide catheter was retracted  from the left iliac artery and  repositioned in the right iliac artery and additional angiography of the  left iliac artery was performed using manual injection of contrast. This  showed the accessory internal iliac artery with subbranches, as there was  some concern that the dual lumen  which was visualized may  have represented  dissection of the vessel.   As the artery  appeared to be stable, the  wire and the guiding catheter  were retracted  and the sheaths were secured into position. The sheaths will  be removed when the ACT returns to normal. He tolerated the procedure well  and was transferred to the floor in stable condition.   FINAL RESULTS:  Successful PTA and stent placement in the right common iliac  artery with reduction of 95% narrowing to 0% with placement of 7 x 29  Genesis stent.                                               Veneda Melter, M.D.    Melton Alar  D:  06/07/2003  T:  06/07/2003  Job:  [id number]  cc:   Nilda Simmer, M.D.

## 2011-01-18 NOTE — Discharge Summary (Signed)
NAMEALSON, MCPHEETERS                          ACCOUNT NO.:  [id number]   MEDICAL RECORD NO.:  [id number]                   PATIENT TYPE:  INP   LOCATION:  5738                                 FACILITY:  [facility]   PHYSICIAN:  Leighton Roach McDiarmid, M.D.             DATE OF BIRTH:  December 03, 1950   DATE OF ADMISSION:  04/18/2002  DATE OF DISCHARGE:  04/21/2002                                 DISCHARGE SUMMARY   DISCHARGE DIAGNOSES:  1. Ileus, resolved.  2. Hypothyroidism.  3. Hypertension.  4. Depression.  5. Anxiety.  6. Tobacco abuse.  7. Coronary artery disease.  8. History of cerebrovascular accident.  9. History of gastric ulcer.  10.      Hiatal hernia.  11.      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  12.      Conversion disorder.   CONSULTATIONS:  General surgery, Dr. Lindie Spruce.  Pulmonary, Dr. Jayme Cloud.   DISCHARGE MEDICATIONS:  The patient was to continue to take all of his home  medications.  1. Advair Diskus 250/50 b.i.d.  2. Albuterol MDI 2 puffs q.2h. p.r.n.  3. Altace 1.25 mg q.d.  4. Ambien 10 mg p.o. q.h.s.  5. Aspirin 81 mg p.o. q.d.  6. Atrovent MDI  q.6h. p.r.n.  7. Hydrochlorothiazide 12.5 mg p.o. q.d.  8. Imdur 30 mg p.o. q.d.  9. Klonopin 0.5 mg p.o. t.i.d.  10.      Levoxyl 25 mcg p.o. q.d.  11.      Lexapro 20 mg p.o. q.d.  12.      MiraLax 1 teaspoon in 8 ounces of water.  13.      Plavix 75 mg p.o. q.d.  14.      Simethicone 40 to 80 mg p.o. q.i.d. p.r.n.  15.      Trazodone 150 mg 1/2 tablet q.h.s.  16.      Valium 10 mg 1/2 tablet p.r.n.  17.      Vitamin B6 150 mg p.o. b.i.d.  18.      The patient has been instructed to stop taking Pepcid and start     taking Protonix 40 mg 1 p.o. q.d.  19.      The patient was given a prescription for Phenergan 25 mg p.o. q.4h.     as needed for nausea.   SPECIAL INSTRUCTIONS:  The patient was instructed to return if abdominal  pain returns or with vomiting.  He can either call the family  practice  center or come straight to the ED.   FOLLOW UP:  The patient was given a followup appointment with Dr. Katrinka Blazing.   HOSPITAL COURSE:  The patient is a 60 year old white male who presented to  the ED with severe abdominal pain that progressively worsened over the week  prior to admission.  He had increased abdominal distention for three days at  that time.  He  did complain of nausea and feeling hot.  His last bowel  movement had been the day before which he described as nonbloody and hard.  Pain was periumbilical and sharp in nature and constant in character.  He  did not take any medications for this pain.  The patient had evidently  vomited brown vomitus with a description like feces.  The patient had an  acute abdominal series that showed left lower infiltrate in the lungs as  well as chronic interstitial changes, demonstrated changes in the lung.  There were also air-filled distended loops of small bowel.  The air did  extend into the rectum which was decompressed.  There were also scattered  air/fluid levels seen on the erect projection.  It was an impression that  the patient had a small-bowel obstruction.  Thus a CT of the pelvis and  abdomen was obtained showing that the patient did have paraseptal emphysema  and dependent subsegmental atelectasis as well as a periesophageal hiatal  hernia.  Also, the patient had multiple bilateral renal cysts with a right  mid kidney lesion technically nonspecific in its symphony.  In addition, he  had no retroperitoneal adenopathy and mild dilated small bowel with  air/fluid levels present.  There was no transition point noted in the upper  abdomen.  There was also a small amount of free pelvic fluid and a small  amount of fluid in the right lower quadrant.  Cecum, terminal ileum, and  appendix were difficult to differentiate from each other because they were  clumped together in the lower quadrant.  Thus the patient was kept n.p.o.    Repeat x-ray of the abdomen showed progressive dilation of small bowel with  air/fluid levels compatible with small-bowel obstruction.  Thus, an NG tube  was placed.  The patient had a Gastrografin which showed that the terminal  decompressed, and the a focal transition point was not identified.  The  patient then had a small-bowel follow-through noting small bowel of normal  caliber with no mucosal abnormality noted.  Contrast measured terminal ileum  after 3 hours and 10 minutes, and there was no evidence for small-bowel  obstruction.  The NG tube was clamped for two hours.  The patient was given  oral with which he did well.  The NG tube was pulled, and the patient was  able to eat a progressively advanced diet and tolerated this well.  The  following problems were addressed during hospital admission.   1. ILEUS:  This is now resolved.  Unsure of exact etiology.  There is a     family history of motility disorders, and the patient does have a known     history of chronic constipation.  No infectious source was found.   1. HYPOTHYROIDISM:  The patient was continued on an IV Synthroid dose and     then switched back to p.o. Levothyroxine for his home medication.   1. HYPERTENSION:  the patient's blood pressure looked fairly good during his     stay, and he was unable to be given his p.o. blood pressure medications     for approximately two days, but these were restarted, and the patient     continued to take them at home.   1. DEPRESSION:  The patient was unable to take his medications while n.p.o.,     and this should be restarted at home.   1. ANXIETY:  Same as for above for Depression.   1. TOBACCO ABUSE:  The patient was encouraged to discontinue his smoking     habit, especially with his lung disorders.   1. CORONARY ARTERY DISEASE:  The patient was immobile during his stay; thus,    he was not given any prophylactic medication.  He was restarted on Plavix     and aspirin, ACT  inhibitor, and HCTZ upon discharge.   1. HISTORY OF CEREBROVASCULAR ACCIDENT:  There were complications during     this admission.   1. HISTORY OF GASTRIC ULCERS:  Pepcid was discontinued, and the patient was     given a prescription for Protonix.  It was felt the patient would most     likely benefit greater from being on a daily PPI.   1. HIATAL HERNIA:  This was noted on the abdominal CT.  This can be followed     up as an outpatient.   1. INCIDENTAL SEVERE BULLOUS DISEASE STATUS POST RESECTION:  The patient did     not require oxygen during his stay.  His lung exam was stable.  He was     given albuterol and Atrovent nebulizers scheduled during this admission,     but he can go home using his typical Advair, Atrovent, and albuterol MDI     as instructed prior to admission.   1. CONVERSION DISORDER:  No complications during admission.     Katy Fitch Netta Cedars, M.D.               Etta Grandchild, M.D.    CVN/MEDQ  D:  04/21/2002  T:  04/23/2002  Job:  [id number]   cc:   Nino Parsley, M.D.   Venita Lick. Pleas Koch., M.D. [facility] D. Maple Hudson, M.D.

## 2011-01-18 NOTE — Consult Note (Signed)
[facility]  Patient:    Carlos [name], Carlos [name]                     MRN: [id number] Adm. Date:  [id number] Attending:  Lesly Dukes CC:         [name], M.D.   Consultation Report  REFERRING PHYSICIAN:  [name], M.D.  HISTORY:  Carlos [name] is a 60 year old gentleman who was admitted on October 08, 2000 for a new onset right brain stroke with left hemisensory deficit and hemiataxia.  The patient was noted to have substernal chest pressure on review of systems prior to admission to the hospital and throughout his hospitalization he has had two further episodes of chest pressure.  The chest pressure has been described as a heavy weight on his chest.  He initially noted the chest pressure during the summer.  The chest pressure appears to be exacerbated by exertion and relieved with rest.  He has associated shortness of breath but he is short of breath at baseline.  No nausea.  No vomiting.  No diaphoresis.  ECG was obtained with the chest pressure while in the hospital.  There was no change from his baseline ECG which revealed a normal sinus rhythm, left anterior fascicular block.  There is nonspecific ST changes.  The patient has also had serial CKs at the time which are within normal limits and normal troponin Is.  Coronary risk factors include male sex, hypertension, and ongoing tobacco abuse.  PAST MEDICAL HISTORY: 1. Untreated hypertension. 2. Depression. 3. Ongoing tobacco use. 4. Lacunar strokes.  PAST SURGICAL HISTORY:  Tonsillectomy.  CURRENT MEDICATIONS: 1. Norvasc 5 mg p.o. q.d. 2. Aspirin 325 q.d. 3. Catapres 0.3 patch. 4. Paxil.  SOCIAL HISTORY:  The patient smokes one and one-half packs per day.  No history of alcohol use.  He is married, lives in [place], and works by transporting cars for long distances.  He has four children who are alive and well.  ALLERGIES:  PENICILLIN.  FAMILY HISTORY:  Significant  for mother dying at age 28 with renal failure. Father died with brain tumor and cancer.  One sister is alive and well. Questionable coronary artery disease in uncles.  REVIEW OF SYSTEMS:  He has had no fever, chills.  No change in bowel or bladder habits.  He has had chest pain as noted above in the history of present illness, occasional nonproductive cough.  PHYSICAL EXAMINATION:  VITAL SIGNS:  Blood pressure ranging 156-190/84, heart rate 55-60, O2 saturation 96% on room air.  Initial blood pressure 205.  HEENT:  Unremarkable.  NECK:  Patient has good carotid upstrokes.  There is no carotid bruit. Thyroid is not palpable.  PULMONARY:  Breath sounds are diminished.  There is mild expiratory wheezing noted.  CARDIOVASCULAR:  PMI is difficult to evaluate.  Heart tones are somewhat distant.  There was no audible murmur on examination.  ABDOMEN:  Soft, benign, nontender.  No bruits are noted.  No obvious hepatosplenomegaly is noted.  EXTREMITIES:  Do not reveal any peripheral edema.  Distal pulses are palpable.  NEUROLOGIC:  Per neurological examination.  LABORATORIES:  CT scan of the head revealed old bilateral lacunar type strokes.  MRI of the head revealed acute ischemic infarction of the right thalamus.  Chest x-ray reveals COPD with ______.  There was a left perihilar pneumonia versus compressive atelectasis.  His echo was suboptimal.  LV appeared to be upper limits with an ejection  fraction of 40%.  There was moderate aortic sclerosis and mild annular calcification.  ECG revealed normal sinus rhythm with anterior fascicular block, nonspecific ST changes.  His electrolytes were within normal limits.  Creatinine 1.4.  CPK 64 with normal MBs.  Troponin I was less than 0.01.  Risks, benefits, and options were discussed with the patient.  The patient is at a low to moderate risk for a cardiac event.  His chest pain is typical for cardiac by history.  However, this has been  present for more than six to eight months.  In view of his new neurological symptoms, would prefer to wait for stabilization of neurology before proceeding with cardiac workup unless the patient develops acute ischemic changes on his ECG or chest pain which is hemodynamically unstable.  These options were discussed with the patient.  The risk of cardiac catheterization including worsening of stroke, hemorrhagic bleed were discussed with the patient as well.  Would add Lovenox therapy if this is safe from the neurological point.  Would hold on sublingual nitroglycerin in view of his neurological symptoms unless he has persistent prolonged chest pain.  Agree with the initiation of Norvasc.  Agree with enteric coated aspirin.  I will discuss the patient further with you. DD:  10/10/00 TD:  10/12/00 Job: [id number]

## 2011-01-18 NOTE — Procedures (Signed)
[place]  Patient:    Carlos [name], Carlos [name]                     MRN: [id number] Adm. Date:  [id number] Attending:  Young, Copy CC:         Kinnie Scales. Reed Breech, M.D.  Feliciana Rossetti, M.D.  [name], M.D.  Anselmo Rod, M.D.  Peter M. [place], M.D.   Procedure Report  BRONCHOSCOPY REPORT  INDICATIONS FOR PROCEDURE:  A 60 year old man being evaluated for a slowly growing density in the superior segment of the right lower lobe by comparison CT scans between February and May 2002.  Complicated medical history of chronic tobacco abuse, cerebrovascular accident, very severe bullous emphysema, coronary artery disease, hypertension, anxiety disorder, and colon polyp with history of GI bleed.  Bronchoscopy performed to attempt identification of the right lower lobe mass.  Preoperative evaluation is outlined on the hospital chart, but included detailed discussion with the patient and his wife and family emphasizing the risks inherent in sedation and endobronchial evaluation for this man with very severe emphysema on anticoagulation with aspirin and Plavix.  DESCRIPTION OF PROCEDURE:  After fully informed consent bronchoscopy was performed in an inpatient basis in the endoscopy suite without premedication. The upper airway was anesthetized topically with cetacaine spray and 1% Xylocaine.  Oxygen was provided at 10 L/minute by nasal prongs holding saturations 92-94%.  Cardiac monitor showed normal rhythm.  Accumulative dose of 6 mg of intravenous Versed was required for additional sedation and cough control and was well tolerated.  An Olympus fiberoptic bronchoscope was passed through a bite block at the mouth after unsuccessful efforts to pass it through either naris narrowed by cartilage.  Secretions were mucoid and very thick requiring flushing with saline and repeated suctioning to clear.  There were no endobronchial lesions and the  mucosa was not particularly inflamed. Anatomy was normal to the fourth and fifth division level bilaterally.  With fluoroscopic guidance the bronchoscope was directed into the right lower lobe with particular attention to the superior segment.  This area was lavaged with saline and then brushed by standard technique.  No biopsies were attempted. He tolerated the procedure extremely well with no apparent complication and will be held until stable before return to his hospital room.  FINAL IMPRESSION:  Right lower lobe density.  Organized pneumonia or possible cancer are the most likely explanations. DD:  01/14/01 TD:  01/14/01 Job: [id number]

## 2011-01-18 NOTE — Cardiovascular Report (Signed)
Carlos [name], Carlos [name]              ACCOUNT NO.:  [id number]   MEDICAL RECORD NO.:  [id number]          PATIENT TYPE:  INP   LOCATION:  2038                         FACILITY:  [facility]   PHYSICIAN:  Rollene Rotunda, M.D.   DATE OF BIRTH:  June 03, 1951   DATE OF PROCEDURE:  11/28/2005  DATE OF DISCHARGE:                              CARDIAC CATHETERIZATION   PROCEDURE:  Left heart catheterization, coronary arteriography.   INDICATIONS FOR PROCEDURE:  Evaluate patient with chest pain and previous  coronary disease.   PROCEDURE NOTE:  Left heart catheterization performed via the left femoral  artery.  The vessel was cannulated using a Smart needle.  Preformed Judkins  and a pigtail catheter were utilized.  The patient tolerated the procedure  well and left the lab in stable condition.  Of note, we did sew the sheath  in and give him 3000 bolus of heparin.  The patient was to have a cerebral  angiogram immediately following this.   RESULTS:  Hemodynamics:  LV 121/13, AO 125/89.   Coronaries:  The left main had diffuse mid 25% stenosis.  The LAD had  proximal luminal irregularities.  There was a mid long 25% stenosis.  A  first diagonal had a proximal to mid focal 50% lesions, it was a moderate  size vessel.  The circumflex was a co-dominant system.  The AV groove had  luminal irregularities.  The ramus intermedius was large branching and was  normal.  OM1 and OM2 were small with diffuse luminal irregularities.  The  posterolateral was small and normal.  The PDA was moderate size and normal.  The right coronary artery was a moderate size co-dominant vessel.  There  were diffuse luminal irregularities.  There was a patent proximal stent.   Left ventriculogram:  The left ventriculogram was obtained in the RAO  projection.  The EF was 60% with mild inferior hypokinesis.   CONCLUSION:  Nonobstructive coronary artery disease.  Patent right coronary  stent.   PLAN:  The patient will continue to  have medical management and secondary  risk reduction.           ______________________________  Rollene Rotunda, M.D.     JH/MEDQ  D:  11/28/2005  T:  11/30/2005  Job:  [id number]   cc:   Thomos Lemons, D.O. [facility]  [address] [place], [place] [address]   Jonelle Sidle, M.D. [facility]  [address]  [place]  [place] [address]

## 2011-01-18 NOTE — H&P (Signed)
   NAME:  Carlos [name], Carlos [name] NO.:  [id number]   MEDICAL RECORD NO.:  [id number]                   PATIENT TYPE:  INP   LOCATION:  2019                                 FACILITY:  [facility]   PHYSICIAN:  Rollene Rotunda, M.D.                DATE OF BIRTH:  Feb 12, 1951   DATE OF ADMISSION:  06/09/2003  DATE OF DISCHARGE:                                HISTORY & PHYSICAL   PRIMARY CARE PHYSICIAN:  Dr. Nilda Simmer, [facility].   REASON FOR PRESENTATION:  Evaluate the patient with groin hematoma.   HISTORY OF PRESENT ILLNESS:  The patient is a pleasant 60 year old gentleman  status post percutaneous transluminal coronary angioplasty and stenting of  the right common iliac.  I do not have the cath report.  However, this was  done on October 5.  Access was apparently obtained through both right and  left femoral arteries as he has bilateral groin sticks.  He apparently did  have some difficulty with hemostasis at the time of   Dictation ended at this point.                                                Rollene Rotunda, M.D.    JH/MEDQ  D:  06/09/2003  T:  06/10/2003  Job:  [id number]

## 2011-01-18 NOTE — H&P (Signed)
[place]  Patient:    Carlos [name], Carlos [name]                     MRN: [id number] Adm. Date:  [id number] Attending:  Young, Copy CC:         Clinton D. Maple Hudson, M.D.  [name], M.D.  Lacretia Leigh. Quintella Reichert, M.D.   History and Physical  CHIEF COMPLAINT:  Right-sided pleuritic chest pain.  HISTORY OF PRESENT ILLNESS:  This 60 year old white male, active smoker with severe bullous emphysema, followed by Dr. Fannie Knee, just discharged from the hospital in April of this year with a history of cerebrovascular disease and left-sided numbness. Also a history of mild coronary disease and hypertension, reflux disease and depression, severe anxiety. The patient comes in tonight after an episode of vomiting and cough paroxysm. He has been coughing up thick green mucus throughout the day with increased wheezing and shortness of breath noted, as well. This resulted then in right-sided pleuritic chest pain, which progressed this evening. He was brought in by ambulance for evaluation. We are asked to admit the patient. The patient has a history of FEV-1 of 38% of predicted, FVC of 53% of predicted. Recently evaluated by Dr. Maple Hudson in 2002 for pulmonary status. The patient on an Advair inhaler at home. The patient was discharged in April 2002 with Plavix 85 mg a day and aspirin once a day. He stopped the Plavix about a week ago, as he is scheduled to have a colonoscopy this coming week.  PAST MEDICAL HISTORY:  1. Cerebrovascular accident.  2. Severe COPD, as noted above.  3. Previous cardiac catheterization showing non-dominant right coronary lesion and also disease in the second diagonal, distal disease in the     apical recurrent branch with mild hypokinesis.  4. History of depression and anxiety.  5. History of hypertension.  6. History of TB exposure in the past with negative PPDs.  7. 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  8. No history of pulmonary embolism.  9. History of hiatal hernia. 10. History of chronic back pain.  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 He has been out of work because of recent health problems.  FAMILY HISTORY:  Father died of brain cancer. Mother died of just old age.  ALLERGIES:  PENICILLIN.  MEDICATIONS PRIOR TO ADMISSION: 1. Valium 2 mg t.i.d. 2. Atacand 32/12.5 daily. 3. Tiazac 300 mg daily. 4. Plavix 75 mg daily. 5. Aspirin 325 mg daily. 6. Albuterol and Atrovent inhaler. 7. Advair inhaler. 8. Trazodone 100 mg h.s.  PHYSICAL EXAMINATION:  VITAL SIGNS:  Temperature 98, pulse 100, blood pressure 130/80.  GENERAL:  An acutely white male in mild respiratory distress secondary to right-sided pleuritic chest pain.  CHEST:  Distant breath sounds and hyperresonance to percussion, right greater than left. Expiratory wheezes noted. There is pinpoint tenderness in the right lateral chest wall between the intercostal spaces of 4, 5 and 6 in the midaxillary line, which reproduces the pain the patient is experiencing.  CARDIAC:  Distant heart tones, difficult to auscult. Without overt S3. Normal S1 and S2. No ausculted murmurs.  ABDOMEN:  Soft and nontender. Bowel sounds active. Liver is not enlarged. No hepatosplenomegaly.  EXTREMITIES:  No edema, clubbing or venous disease.  NEUROLOGICAL:  Grossly intact. The patient is slightly anxious. The patient moves  all fours. Cranial nerves II-XII intact.  HEENT:  Oropharynx clear. Extraocular movements intact. Pupils equal, round and reactive to light and accommodation. Nares clear.  NECK:  Supple. No jugular vein distension. No lymphadenopathy.  SKIN:  Clear.  LABORATORY DATA:  pH 7.45, PCO2 43, PO2 84 on 1 L oxygen nasal cannula. Sodium 139, potassium 4.4, chloride 102, BUN 16, blood sugar 106, creatinine 1.6. Hemoglobin 13, hematocrit 38.  EKG  showed normal sinus rhythm, no acute changes.  Chest x-ray showed bullous emphysema in the right upper lobe and left upper lobe with scarring of both lower lobes. No acute infiltrates seen.  IMPRESSION: 1. Acute right musculoskeletal chest wall pain secondary to coughing paroxysms    in a patient with chronic obstructive lung disease with bullous emphysema    and asthmatic bronchitic flare without evidence of active pneumonia. Doubt    pulmonary embolism. 2. Depression and anxiety. 3. History of cerebrovascular accident in the past. 4. Coronary artery disease with hypertension. 5. Gastroesophageal reflux disease with probable flare today, as well. 6. Active smoking.  RECOMMENDATION: 1. Begin nebulized albuterol and Atrovent, and pulmicort on a q.i.d. basis.    Hold off on IV steroids, as of yet. 2. Check sputum C&S, gram stain. Start IV Rocephin for acute bronchitic flare. 3. Administer oxygen supplemental. 4. Followup on spiral CT of the chest, already obtained in the emergency    department, although do not expect will find a pulmonary embolism. 5. Medicate the patient for pain including nonsteroidal anti-inflammatory and    IV morphine. 6. Followup lab data in the a.m. DD:  01/11/01 TD:  01/11/01 Job: [id number]

## 2011-01-18 NOTE — H&P (Signed)
NAME:  Carlos [name], Carlos [name] NO.:  [id number]   MEDICAL RECORD NO.:  [id number]                   PATIENT TYPE:  INP   LOCATION:  2019                                 FACILITY:  [facility]   PHYSICIAN:  Rollene Rotunda, M.D.                DATE OF BIRTH:  March 27, 1951   DATE OF ADMISSION:  06/09/2003  DATE OF DISCHARGE:                                HISTORY & PHYSICAL   PRIMARY CARE PHYSICIAN:  Nilda Simmer, M.D., [facility].   REASON FOR ADMISSION:  Evaluate the patient with groin hematoma.   HISTORY OF PRESENT ILLNESS:  The patient is a pleasant 60 year old gentleman  with a PTCA and stent of a right common iliac lesion on June 07, 2003.  I  do not have the details of that procedure, however, it is obvious that he  had bilateral access through both femoral arteries.  He reports some  difficulty obtaining hemostasis on the left.  At home, he developed a large  hematoma extending from his left groin into his suprapubic and pubic area  causing discoloration and swelling of his penis and scrotum.  He presented  to the hospital tonight when the pain was too intense for him to walk. He  has not had fevers.  He has not had any decreased urination.  He has had no  calf tenderness.  He denies any shortness of breath or chest pain.   PAST MEDICAL HISTORY:  Coronary artery disease (70% first diagonal stenosis,  30 to 40% circumflex stenosis, ramus intermediate 60% stenosis, right  coronary artery 95% stenosis reduced to 0 with a Cypher stent),  hyperlipidemia, hypertension, TIA/CVA, hypothyroidism, gastroesophageal  reflux disease, COPD, peripheral vascular disease with right common iliac  95% stenosis, status post stenting.   PAST SURGICAL HISTORY:  Right lung resection for repair of bullous  emphysema.   ALLERGIES:  PENICILLIN, SULFA.   MEDICATIONS:  1. Plavix 75 mg daily.  2. Aspirin 325 mg daily.  3. Hydrochlorothiazide 12.5 mg daily.  4.  Levoxyl 50 mcg daily.  5. Bupropion 75 mg b.i.d.  6. Protonix 40 mg.  7. IMDUR 30 mg.  8. Advair.  9. Desyrel 200 mg q.h.s.   SOCIAL HISTORY:  The patient lives in [place] with his wife. He is  unemployed.  He smokes cigarettes.   FAMILY HISTORY:  Contributory for coronary artery disease in first degree  relatives.   REVIEW OF SYSTEMS:  As stated in the HPI, positive for depression and  anxiety.  Otherwise, negative for all other systems.   PHYSICAL EXAMINATION:  GENERAL: The patient is in no distress while lying  still.  VITAL SIGNS: Blood pressure 134/82, heart rate 74 and regular, afebrile.  LUNGS:  Clear to auscultation bilaterally.  BACK:  No costovertebral angle tenderness.  CHEST:  Unremarkable.  HEART:  PMI not displaced or sustained.  S1 and S2 within normal limits.  No  S3, no S4. No murmurs.  ABDOMEN:  Positive bowel sounds, no rebound, and no guarding.  EXTREMITIES:  2+ upper pulses, 2+ dorsalis pedis bilaterally, bilateral  hematomas over the femorals without bruits or pulsatile mass.  GENITOURINARY: The scrotum and penis are swollen and ecchymotic.   LABORATORY DATA:  Pending.   ASSESSMENT:  Groin hematoma.  The patient has groin hematoma status post  interventional procedure.  Will get serial CBC's.  He will be managed with  pain medications.  Will get a CT of the pelvis to rule out retroperitoneal  bleed and to define the extent of the bleed.  He will probably need a groin  ultrasound as well to rule out AV fistula or pseudoaneurysm, although, right  now this will be too painful.                                                Rollene Rotunda, M.D.    JH/MEDQ  D:  06/09/2003  T:  06/10/2003  Job:  [id number]

## 2011-01-18 NOTE — Consult Note (Signed)
[place]. [facility]  Patient:    Carlos [name], Carlos [name] Visit Number: [id number] MRN: [id number]          Service Type: MED Location: [phone] Attending Physician:  Tobin Chad Dictated by:   Meade Maw, M.D. Admit Date:  01/11/2002 Discharge Date: 01/13/2002   CC:         Pricilla Holm, M.D.  Clarene Essex, M.D.   Consultation Report  REFERRING PHYSICIAN:  Pricilla Holm, M.D.  INDICATIONS FOR CONSULTATION:  Chest pain and bradycardia.  HISTORY:  Carlos [name] is a 60 year old gentleman known to me from previous coronary angiography.  Left heart catheterization was performed on February 2002.  At this time he was noted to have a nondominant right which was subtotally occluded.  There was a 50-60% lesion in his second diagonal and minimal and proximal circumflex disease.  It was felt that the right coronary artery was the etiology of his angina.  The angiograms were reviewed by Dr. Verdis Prime and it was felt that medical management would be his best option. Carlos [name] is a rather difficult historian but it would appear that he has had some problems with ongoing dizziness and chest pain for several months which became progressively worse promoting his visit to the emergency room.  At the time of admission he was walking, stated that he felt very unsteady.  Fell against a wall and hit his arm as a result.  HOSPITAL COURSE:  Reviewed.  A CT scan was performed.  He was noted to have no acute intracranial abnormality.  There was small vessel disease with small lacunar infarct.  His CT scan was unchanged from November 2002.  Throughout his admission he he has been noted to have a bradycardia with rates as low as 35 beats per minute during his sleep.  During his waking hours he has been noted to have a heart rate of 50-60.  There has been no complaints of dizziness with this heart rate.  He has had a thyroid function test with his TSH noted  to be 2.44.  Serial cardiac enzymes were obtained as well.  He has had normal CKs.  His troponin I has been less than 0.01.  Cholesterol was obtained.  He is noted to have an LDL of 62.  His HDL is 38.  He continues to have ongoing tobacco use.  Since his admission his trazodone and Wellbutrin have been discontinued.  He has had no further complaints of dizziness while in hospital.  PAST MEDICAL HISTORY:  1. Hypothyroidism.  2. Dyslipidemia.  3. Depression.  4. Anxiety.  5. Tobacco abuse.  6. Hypertension.  7. Coronary artery disease as noted above.  8. 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  9. Intermittent claudication. 10. History of DVT. 11. History of COPD. 12. History of a gastric ulcer with hemorrhage. 13. Hiatal hernia. 14. Low back pain.  PAST SURGICAL HISTORY:  The patient is status post an embolectomy at [facility].  He is status post left heart catheterization as noted above.  MEDICATIONS:  1. Advair diskus 250/50.  2. Albuterol p.r.n.  3. Aricept 10 mg p.o. q.h.s.  4. Aspirin 81 mg daily.  5. Atacand 32/12.5 currently on hold.  6. Atrovent MDI p.r.n.  7. Levoxyl 25 mcg daily.  8. Lexapro 20 mg daily.  9. Pepcid 20 mg b.i.d. 10. Plavix 75 mg daily. 11. Trazodone 150 mg p.o. q.h.s. 12. Valium p.r.n. 13. Vitamin B6 daily. 14. Wellbutrin 150 b.i.d.  15. Zocor 40 mg q.d.  As noted above, these medications have been continued with the exception of trazodone and Wellbutrin.  SOCIAL HISTORY:  The patient is a retired Naval architect.  He is married.  This is his fourth marriage.  He has more than an 80-pack-year smoke history. States he is down to five cigarettes daily now.  FAMILY HISTORY:  Father passed of brain cancer.  Mother passed of old age.  REVIEW OF SYSTEMS:  He has had stumbling and leg weakness for eight to 10 months.  He has had no fevers, chills, nausea, orthopnea, PND, tachyarrhythmias.  PHYSICAL EXAMINATION  VITAL SIGNS:   Systolic pressure 150, diastolic 60.  He is afebrile.  O2 saturation is 94% on room air.  HEENT:  Unremarkable.  LUNGS:  Breath sounds are diminished.  There is diffuse wheezing noted.  CARDIOVASCULAR:  Regular rate and rhythm.  He is bradycardic.  His PMI is not displaced.  ABDOMEN:  Soft and nontender.  There is no unusual bowel sounds.  EXTREMITIES:  No clubbing, cyanosis, edema.  LABORATORIES:  ECG was reviewed.  He was noted to have a sinus bradycardia with a rate of 41 beats per minute on admission.  There were nonspecific ST changes.  He has been noted to have a sinus rhythm with a rate of 70 on review of his chart.  There is no other significant intraventricular conduction delay.  Other laboratory data is as noted above.  IMPRESSION: 1. In review this is a 60 year old gentleman admitted for presyncope.  His    serial cardiac enzymes have been negative.  His catheterization was    reviewed.  He has been noted to have bradycardia of undetermined origin    throughout his admission.  He has been asymptomatic, however.  In view of    his asymptomatic bradycardia, would not proceed with pacemaker implantation    at this time.  He may be discharged to home.  An event monitor should be    obtained.  If we can document correlation of the dizziness with    bradycardia, would consider pacemaker implantation at that time. 2. Coronary artery disease.  He has had no further complaints of chest pain.    He has been instructed on sublingual nitroglycerin in the past.  He should    be discharged on sublingual nitroglycerin and long-term nitrates should be    considered as well. 3. Dyslipidemia.  His LDL is at goal.  If a liver profile has not been    obtained recently, this should be considered. 4. Hypertension.  His systolic pressure is 150.  May want to consider     restarting Atacand to assist in his anginal control.  I will see the patient    in follow-up in six weeks. Dictated  by:   Meade Maw, M.D. Attending Physician:  Tobin Chad DD:  01/13/02 TD:  01/15/02 Job: [id number]

## 2011-01-18 NOTE — Procedures (Signed)
EEG NUMBER:  [id number]   HISTORY:  This is a 60 year old patient who is being evaluated for a  possible stroke event.  The patient has had episodes of staring spells and  is being evaluated for seizures.  This is a portable EEG.  No skull defects  are noted.  Medications include fentanyl, Apresoline, Activase, Lorcet,  albuterol, Reglan, Cardene, Tylenol.   EEG CLASSIFICATION:  Normal awake.   DESCRIPTION OF RECORDING:  The background rhythm of this recording consists  of a fairly well modulated medium-amplitude alpha rhythm of 8 Hz that is  reactive to eye opening and closure.  As the record progresses, the patient  appears to remain in the waking state throughout the recording.  Photic  stimulation and hyperventilation were not performed.  At no time during this  recording does there appear to be evidence of spikes or spike wave  discharges or evidence of focal slowing.  EKG monitor shows no evidence of  cardiac rhythm abnormalities with a heart rate of 66.   IMPRESSION:  This is a normal EEG recording in the waking state.  No  evidence of ictal or interictal discharges are seen.      [name], M.D.  Electronically Signed     [id number]  D:  04/22/2006 17:54:48  T:  04/23/2006 11:53:02  Job #:  [id number]

## 2011-01-18 NOTE — Discharge Summary (Signed)
NAMEJAIDAN, [name]              ACCOUNT NO.:  [id number]   MEDICAL RECORD NO.:  [id number]          PATIENT TYPE:  IPS   LOCATION:  4003                         FACILITY:  [facility]   PHYSICIAN:  Ranelle Oyster, M.D.DATE OF BIRTH:  [birth year]   DATE OF ADMISSION:  12/10/2005  DATE OF DISCHARGE:  12/20/2005                                 DISCHARGE SUMMARY   DISCHARGE DIAGNOSES:  1.  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  2.  Hemoptysis, resolved.  3.  Chronic obstructive pulmonary disease.  4.  Coronary artery disease.  5.  Depression with anxiety features.  6.  Cerebrovascular accident with mild left hemiparesis.   HISTORY OF PRESENT ILLNESS:  Carlos [name] is a 60 year old male with history  of coronary artery disease, hypertension, prior CVA with mild left  hemiparesis admitted on March 24, with worsening of chest pain and shortness  of breath.  He was evaluated by [facility] and started on IV heparin  initially.  Cardiac enzymes done were negative.  CT angiogram of chest  showed no PE, just severe emphysema.  Dr. Orlin Hilding was consulted for  inpatient with an episode of staring spell and there was a question of TIA  versus seizure activity.  MRI of brain done showed no acute changes, 50%  left vertebral stenosis and this was not of significance for neurology  input.  The patient with hemoptysis and sinusitis per MRI.  Critical care  medicine was consulted for input and recommended Avelox x14 days as well as  steroid taper.  He is to follow up with Dr. Solon Augusta past discharge.  Bilateral  carotid artery cerebral angiography showed left vertebral with mild  reconstitution plaque in proximal left subclavian artery in left basilar  artery secondary to plaque and developmental basis.  MRI of C-spine done  showed stenosis at C5-C6 with HNP at C6-C7 with narrowing and mild  flattening of right side of cord with no acute  change.  The patient  continued with left-sided numbness and was started on Neurontin with dose  being increased for neuropathy.  Currently, the patient continues with  impairments in balance and mobility with multiple loss of balance and  tendency to over correct.  He was noted to have ataxia with decrease in  motor planning.  Rehabilitation was consulted for further therapies.   PAST MEDICAL HISTORY:  1.  Coronary artery disease with ischemic heart disease and stent in 2004.  2.  Esophagitis.  3.  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  4.  Hemorrhoids.  5.  Hypertension.  6.  Depression with anxiety disorder.  7.  Right CVA with short-term memory deficits.  8.  Emphysema.  9.  Conversion disorder.  10. Hypothyroidism.   ALLERGIES:  PENICILLIN and SULFA.   FAMILY HISTORY:  Cancer.   SOCIAL HISTORY:  The patient is married and lives in one-level home with two  steps at entry.  Disabled, but independent and sedentary prior to admission.  The patient continues to smoke one pack per day.  He does not use any  alcohol.  Wife works approximately three times a week.   HOSPITAL COURSE:  Carlos [name] was admitted to rehabilitation on November 09, 2005, for inpatient therapies to consist of PT/OT daily.  Past  admission, the patient was tapered off steroids and nebulizers were  continued on four times a day basis initially.  Plavix was initially at 600  mg p.o. t.i.d.  Secondary to balance problems, this was decreased at 300 mg  p.o. t.i.d. and the patient has had no complaints of increase in neuropathy.  The patient has had some nonspecific aches and pains and was scheduled with  Tylenol 650 mg p.o. q.a.m.  Follow up labs done past admission revealed  hemoglobin 12.9, hematocrit 37.4, white count 8.8 and platelets 300.  Check  of electrolytes revealed sodium 141, potassium 3.9, chloride 107, CO2 29,  BUN 13, creatinine 1.2, glucose 92.  Protein stores with total protein at  5.8,  albumin 3.1.  LFTs within normal limits with SGOT 17, SGPT 31, Alk phos  68, total bilirubin 0.6.  The patient's anxiety was noted to be an issue.  However, secondary to balance issues, the patient was discontinued off  Klonopin.  Xanax continued at 0.5 mg p.o. nightly.  He does continue with  chronic cough that is improved overall with addition of Mucinex 600 mg p.o.  b.i.d.  The patient did develop inflammation of sebaceous cyst on right  shoulder.  He was started with moist hot packs to help with drainage.  Currently, the patient is noted to have inflamed cyst that his draining  cheesy material.  He is advised to continue moist, warm compresses four  times a day and advised to express to help with drainage of cyst.  Apply  antibiotic ointment past expression.  If cyst does not completely resolve,  the patient is advised to follow up with Dr. Buzzy Han regarding I&D on  outpatient basis.   Dr. Leonides Cave in neuropsychiatry has been following the patient along.  He  recommended using some psychotherapy for predominant behavior in which to  increase activity level and set reasonable light goals.  The patient  rejected psychological treatments stating that he had counseling before and  did not think it was helpful and he did not feel that he warranted this  during this stay.  The patient did participate with therapy and has made  good progress.  He is at supervision ambulating 150 feet x2 with rolling  walker.  Request minimal assist without assisted device.  He was able to  ambulate modified independent level in supervised setting in his room  without difficulty.  He had one loss of balance with rolling walker,  however, was able to self-correct.  The patient requires minimal assistance  for navigating three steps.  In terms of ADLs, the patient is at modified  independent for showering, sitting, standing.  He is at supervision for gathering items of clothing prior to showering, however, was able  to regain  balance without any assist.  The patient will continue with further followup  with PT/OT by [facility] past discharge.  A wheelchair was ordered  additionally for use p.r.n.  The patient is discharged to home on December 20, 2005.   DISCHARGE MEDICATIONS:  1.  Trazodone 100 mg p.o. nightly.  2.  Coated aspirin 81 mg a day.  3.  Imdur 60 mg a day.  4.  Wellbutrin SR 150 mg b.i.d.  5.  Protonix 40 mg a day.  6.  Synthroid 100 mcg a day.  7.  Cymbalta 20 mg nightly.  8.  Lipitor 40 mg nightly.  9.  Neurontin 200 mg p.o. q.8h.  10. Plavix 75 mg a day.  11. Mucinex 600 mg b.i.d.  12. Xanax 0.5 mg p.o. nightly.   DIET:  Low fat diet.   ACTIVITY:  As tolerated with use of rolling walker and wheelchair.  No  strenuous activity, no driving, no smoking.   WOUND CARE:  Continue warm, moist compresses to cyst four times a day.  Express to drain after compress use.  Apply Bacitracin ointment to area.   SPECIAL INSTRUCTIONS:  The patient to continue with nebulizers two to three  times a day as at home.   FOLLOW UP:  The patient is to follow up with Dr. Buzzy Han for routine check.  Follow up with Dr. Antoine Poche and Dr. Solon Augusta in 2-3 weeks.  Follow up with Dr.  Riley Kill as needed.      Greg Cutter, P.A.      Ranelle Oyster, M.D.  Electronically Signed    PP/MEDQ  D:  12/20/2005  T:  12/23/2005  Job:  [id number]   cc:   Rollene Rotunda, M.D.  [address]  [address]  [place]  [place] [address]   C. Lesia Sago, M.D.  Fax: [phone]   Solon Augusta, M.D.

## 2011-01-18 NOTE — H&P (Signed)
[place]. [facility]  Patient:    Carlos [name], Carlos [name]                     MRN: [id number] Adm. Date:  [id number] Attending:  Erich [name] CC:         Carlos [name], M.D.   History and Physical  PATIENT ADDRESS: [address] [place], [place] [address]  CHIEF COMPLAINT: This is one of several [place]. [facility] admissions for this 60 year old right-handed, married white male from [place], [place], admitted from the emergency room for evaluation of slurred speech and left-sided numbness.  HISTORY OF PRESENT ILLNESS: Carlos [name] has a known history of high blood pressure that has probably been untreated in the past, as well as tobacco use with documented chronic obstructive pulmonary disease.  He was admitted to [facility] on October 09, 1999 for evaluation of heaviness in his left hand and arm which had progressed over two days associated with hemisensory loss and ataxia.  This was evaluated at [facility] with MRI scan of the brain, which showed acute infarction in the right thalmus, evidence of periventricular white matter changes, and sinusitis.  MR angiogram showed evidence of intracranial atherosclerosis with extensive small vessel changes and antegrade flow in the left vertebral artery, with diminutive basilar artery and dominant PCA.  Bilaterally there was some signal cutoff in the left middle cerebral artery with dominant PCP bilaterally.  There was some signal cutoff in the left middle cerebral artery.  The patient underwent a 2D echocardiogram and this showed evidence of an ejection fraction of 40-50% with aortic valve thickness moderately increased, mild mitral annular calcification, the left atrium mildly dilated, the right atrium mildly dilated.  The patient underwent a carotid Doppler study and this was unremarkable.  Homocystine level and lipid profile are pending.  Chest  x-ray showed evidence of chronic obstructive pulmonary disease with apical bullae and questionable nodule in the right mid lung field.  Because of this a CT scan of the chest was performed and this showed no significant abnormality in the area of questionable nodule.  The patient was seen by Dr. Meade Maw and underwent cardiac catheterization, and this showed a nondominant right which was subtotally occluded, probably accounting for anginal pain, with 50-60% lesion in the second diagonal and distal disease in the apical recurrent branch.  There was evidence of mild hypokinesis.  The patient went home and was taking aspirin therapy, and was in his usual state of health until the week prior to admission.  He noted when taking showers he had left arm numbness and the day of admission he went to walk his dog and when he came home noted some slurred speech, then developed some anxiety.  EMT service was called and he had evidence of bilateral carpopedal spasm, and he was brought to the emergency room at [facility].  CURRENT MEDICATIONS:  1. Ecotrin 325 mg p.o. q.d.  2. Paxil 40 mg q.d.  3. Norvasc 10 mg q.d.  4. Protonix 40 mg q.d.  5. Diazepam 10 mg b.i.d.  6. Trazodone 50 mg q.h.s.  7. Atacand HCT 32/12.5 mg q.d.  ALLERGIES: PENICILLIN.  SOCIAL HISTORY: He has been smoking two to three packs of cigarettes per day and has now cut down to five cigarettes per day.  He works as a Dietitian.  He is currently not working.  PAST MEDICAL/SURGICAL HISTORY:  1. Hypertension.  2. Chronic obstructive pulmonary disease.  3. Depression.  4. Right brain stroke.  5. Coronary artery disease.  FAMILY HISTORY: His mother died at the age of 29 of unknown causes.  His father died at the age of 74 from metastatic cancer of the lung to the brain.  PHYSICAL EXAMINATION:  GENERAL: Well-developed, anxious, depressed-appearing white male.  VITAL SIGNS:  Blood pressure in right arm 120/80, left arm 120/80; heart rate 80 and regular.  NEUROLOGIC: There were no cranial, cervical, or orbital bruits heard.  Flexion and extension maneuvers were unremarkable.  Mental status examination showed he was alert and oriented x 3 and followed one, two, and three step commands. His cranial nerve examination was unremarkable.  Extraocular movements were full.  Visual fields were full.  Discs were flat.  Corneals were present. There was no seventh nerve palsy.  The tongue and uvula are midline.  Gags were present.  Sternocleidomastoid and trapezius testing normal.  Motor examination revealed giveaway strength with the left hand and arm, poor heel-to-shin with left heel to the right knee.  Sensory examination was intact to pinprick, touch, position, and vibration.  Deep tendon reflexes were 2+. Plantar responses were downgoing.  HEENT: Tympanic membranes were clear.  LUNGS: Decreased breath sounds.  HEART: Without murmurs.  ABDOMEN: Bowel sounds normal.  LABORATORY DATA: CT scan showed bilateral basal ganglia infarctions which appear to be old.  EKG shows normal sinus rhythm and septal infarct, age indeterminate.  Hemoglobin was 13.0, hematocrit 39.  Sodium 139, potassium 4.0, chloride 103, CO2 content 29, BUN 14, creatinine 1.1, glucose 74.  IMPRESSION:  1. Slurred speech, code 784.5.  2. Recurrent left-sided numbness, code 782.0.  4. Hypertension, code 796.2.  4. Chronic obstructive pulmonary disease, code 496.0.  5. Coronary artery disease, code 429.2.  PLAN: The plan at this time is to admit the patient and place him on Plavix and aspirin, get CK with MB and troponin levels, and MRI study of the brain will be obtained.DD:  11/12/00 TD:  11/13/00 Job: [id number]

## 2011-01-18 NOTE — Cardiovascular Report (Signed)
[place]. [facility]  Patient:    Carlos [name], Carlos [name]                     MRN: [id number] Proc. Date: 10/14/00 Adm. Date:  [id number] Attending:  Meade Maw A CC:         Dr. Mliss Fritz C. Quintella Reichert, M.D.   Cardiac Catheterization  PROCEDURE: 1. Left heart catheterization. 2. Coronary angiography. 3. Single plane ventriculogram. 4. Distal iliac runoff.  INDICATION FOR PROCEDURE:  CARDIOLOGIST:  Meade Maw, M.D.  DESCRIPTION OF PROCEDURE:  After obtaining written informed consent, the patient was brought to the cardiac catheterization lab in postabsorptive state.  Preop sedation was achieved using IV Versed.  The right groin was prepped and draped in the usual sterile fashion.  Local anesthesia was achieved using 1% lidocaine.  Despite several attempts, we were unable to access the right femoral artery.  The left groin was then prepped and draped in the usual sterile fashion.  Local anesthesia was achieved using 1% lidocaine.  Using a Smart needle, a 6-French hemostasis sheath was placed into the left femoral artery.  Single plane ventriculogram was performed in the RAO position using a 6-French angled pigtail catheter.  Selective coronary angiography was performed using the JL-4, JR-4 Judkins catheters.  All catheter exchanges were made over a guidewire. Distal iliac runoff was performed using a 6-French pigtail curved catheter.  FINDINGS:  Left Main Coronary Artery:  The left main coronary artery.  It bifurcated into the left anterior descending and circumflex vessel.  The left anterior descending artery gave rise to a small diagonal #1, small diagonal #2, and went on to end as an apical recurrent branch.  There was diffuse disease in the left anterior descending artery.  Diagonal #1 was a moderate size vessel and had luminal irregularities up to 30% in the proximal portion of the vessel.  Diagonal #2 was a small to moderate size vessel,  and there was a 60% lesion in the second diagonal.  The LAD went on to end as an apical recurrent branch.  At the distal LAD, the vessel was less than 1 mm in size.  There was 60 to 70% lesion at the distal apex.  Circumflex vessel:  The circumflex vessel wass a moderate size vessel.  It wa dominant for the posterior circulation.  There was diffuse disease in the circumflex vessel of up to 50 to 60%.  There was a 30 to 40% proximal lesion noted in the circumflex vessel  Right coronary artery:  The right coronary artery was a very small, nondominant artery.  There was diffuse disease in the right coronary artery. There was a 70% proximal stenosis which was followed by subtotal occlusion.  Single plane ventriculogram revealed mild diffuse hypokinesis.  The ejection fraction is 50%.  There was no gradient noted on pullback.  Distal iliac runoff revealed a 70% ostial lesion at the takeoff of the right internal artery, and there was a more distal 50 to 60% lesions noted at the left iliac arteries.  FINAL IMPRESSION: 1. A nondominant right which is subtotally occluded.  This probably accounts    for his anginal pain.  There is a 50 to 60% lesion in the diagonal #2 and    distal disease in the apical recurrent branch. 2. Significant peripheral vascular disease. 3. Mild hypokinesis.  The films were reviewed with Dr. Verdis Prime, and it is felt that medical management will be the patients best  option. DD:  10/14/00 TD:  10/14/00 Job: [id number]

## 2011-01-18 NOTE — Consult Note (Signed)
[name], KESSENICH NO.:  [id number]   MEDICAL RECORD NO.:  [id number]          PATIENT TYPE:  INP   LOCATION:  3743                         FACILITY:  [facility]   PHYSICIAN:  Antonietta Breach, M.D.  DATE OF BIRTH:  Jan 27, 1951   DATE OF CONSULTATION:  04/23/2006  DATE OF DISCHARGE:                                   CONSULTATION   REQUESTING PHYSICIAN:  Kelli Hope, MD.   REASON FOR CONSULTATION:  Depression and rule out somatoform symptoms.   HISTORY OF PRESENT ILLNESS:  Carlos [name] is a 60 year old male admitted to  the [facility] on the 20th of August due to slurred speech and  the need for a stroke evaluation.   The patient describes a number of stresses including his chronic medical  problems as well as having to give up his dog because he cannot take care of  his dog.  His daughter has been in the hospital acutely.  The patient has  been having episodes where he is not able to speak but he can hear.  He  states that he has had insomnia.  His mood has been depressed.  He has also  been having low energy.  His interests are partially intact for TV sports  and car racing.  He has no suicidal thoughts, no thoughts of harming others.  He has no delusions or hallucinations.  He is oriented and is socially  appropriate.   PAST PSYCHIATRIC HISTORY:  In 2002, the patient had a similar presentation  with slurred speech and his evaluation was negative.  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   The patient has had a previous assessment of conversion in the chart.  He  has never been in a psychiatric hospital.  He has never attempted suicide.  He has been treated for depression in the past with Cymbalta 20 mg daily  since April.  He has also been on Wellbutrin SR 150 mg b.i.d.  Other  psychotropics that have been tried include trazodone 100 mg q.h.s. as well  as Paxil.   FAMILY PSYCHIATRIC HISTORY:  The patient's sister has suffered  from  depression and has required ECT.  His father also suffered from depression.   SOCIAL HISTORY:  Mr. Stockley has been married for 12 years.  He has 4 grown  children.  He is currently medically retired.  He had his own business for  some time.  He has no history of alcohol or illegal drugs.   GENERAL MEDICAL PROBLEMS:  1. COPD  2. Hyperlipidemia.  3. Hypertension.  4. History of lung resection.  5. Chronic pain.  6. History of CVA.  7. Coronary artery disease with a cardiac cath in 2006.  8. Peripheral vascular disease with an iliac stent placed in 2004.  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   MEDICATIONS:  The MAR is reviewed.  Psychotropics include:  1. Wellbutrin 150 mg b.i.d.  2. Klonopin 2 mg q.h.s.  3. Cymbalta 20 mg daily.  4. Neurontin 600 mg b.i.d.  5.  Desyrel 100 mg q.h.s.   THE PATIENT IS ALLERGIC TO PENICILLINS AND SULFA.   LABORATORY DATA:  The patient's CBC is unremarkable.  His INR is 1.0.  Metabolic panel is unremarkable.  Calcium is 8.4.  SGOT 18.  SGPT 17.   The patient's head CT without contrast on the 21st of August showed no  evidence of hemorrhage or other acute process.  Minor chronic small vessel  disease.  An MRI of the brain without contrast on the 20th of August showed  no evidence suggesting acute ischemia, probable small vessel disease.   REVIEW OF SYSTEMS:  CONSTITUTIONAL:  Afebrile.  HEAD:  No trauma.  EYES:  No  visual changes.  EARS:  No hearing impairment.  NOSE:  No rhinorrhea.  MOUTH/THROAT:  No sore throat.  NEUROLOGIC:  As above.  PSYCHIATRIC:  As  above.  CARDIOVASCULAR:  No chest pain.  RESPIRATORY:  As above.  GASTROINTESTINAL:  No nausea, vomiting, diarrhea.  GENITOURINARY:  No  dysuria.  SKIN:  Unremarkable.  ENDOCRINE/METABOLIC:  Unremarkable.  HEMATOLOGIC/LYMPHATIC:  Unremarkable.  MUSCULOSKELETAL:  No deformities.   EXAMINATION:  VITAL SIGNS:  Temperature is 98.1, pulse 63, respirations 20,   blood pressure 155/64.  O2 saturation on room air 95%.  MENTAL STATUS EXAM:  [name] is an alert, middle-aged male.  He is  oriented to all spheres.  His fund of knowledge and intelligence are  average.  His mood is mildly depressed.  His affect is mildly constricted.  His speech involves normal rate and prosody.  He is oriented to all spheres.  His memory is intact to immediate, recent, remote.  Thought process:  Logical, coherent, and goal directed.  No lucence of association.  Thought  content:  No thoughts of harming himself.  No thoughts of harming others.  No delusions, no hallucinations.  Insight is partial.  Judgment is intact.  On specific memory testing, 3 out of 3 immediate, 1 out of 3 at 3 minutes.   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  Anxiety disorder not otherwise specified 293.84.  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  Axis II:  Deferred.  Axis III:  See general medical problems.  Axis IV:  General medical.  Primary support group.  Axis V:  55.   The patient is not at risk to harm himself of others.  He agrees to call  emergency services for thoughts of harming himself, thoughts of harming  others, or severe distress.   The patient was given education and ego-supportive psychotherapy.  He is  interested in psychotherapy for improving his depression as well as helping  to improve his coping skills and stress management in an effort alleviate  somatoform symptoms.  The patient wants to proceed with the plan as outlined  below.   RECOMMENDATIONS:  1. Increase the Cymbalta to 40 mg daily as the primary antidepressant.      Would also continue Wellbutrin 150 mg p.o. b.i.d.  2. Would continue the Klonopin 2 mg q.h.s. for antianxiety with the goal      of reducing and eventual elimination as the patient proceeds through      psychotherapy. 3. Would continue Desyrel at 50 to 100 mg  q.h.s. p.r.n. insomnia using      caution in giving the Desyrel with the Cymbalta in that both have      serotonin reuptake inhibition, as with the Klonopin the goal would be  to eventually eliminate the Desyrel, as the Cymbalta/Wellbutrin      combination is continued with psychotherapy.  4. The patient can benefit from ego-supportive therapy for enhancing      coping skills and stress management.  He could also benefit from basic      cognitive behavioral therapy as well as progressive muscle relaxation      and deep breathing training as his COPD would permit.  5. Would ask the case manager to arrange followup for both psychiatric      medication management and psychotherapy at one of the outpatient      psychiatric clinics of [facility], [facility], or      [facility] depending on who can take Medicaid at this time.      Antonietta Breach, M.D.  Electronically Signed     JW/MEDQ  D:  04/23/2006  T:  04/24/2006  Job:  [id number]

## 2011-01-18 NOTE — Cardiovascular Report (Signed)
NAME:  Carlos [name], Carlos [name]                        ACCOUNT NO.:  [id number]   MEDICAL RECORD NO.:  [id number]                   PATIENT TYPE:  OIB   LOCATION:  2854                                 FACILITY:  [facility]   PHYSICIAN:  Veneda Melter, M.D.                   DATE OF BIRTH:  11/12/1950   DATE OF PROCEDURE:  05/05/2003  DATE OF DISCHARGE:                              CARDIAC CATHETERIZATION   PROCEDURES PERFORMED:  1. Right heart catheterization.  2. Left heart catheterization.  3. Left ventriculogram.  4. Selective coronary angiography.  5. Abdominal aortogram.  6. PTCA and stent placement proximal mid right coronary artery.   DIAGNOSES:  1. Coronary atherosclerotic disease.  2. Normal left ventricular systolic function.  3. Normal right heart pressures.  4. Peripheral vascular disease.   HISTORY:  Mr. Carlos [name] is a 60 year old white male with history of  hypertension, dyslipidemia, stroke, claudication, coronary disease, and  ongoing tobacco use who presents with shortness of breath, substernal chest  discomfort, and lower extremity edema.  The patient has known moderate  coronary artery disease by previous angiography in February of 2002 that was  treated medically.  Unfortunately, he has had persistence of right heart  failure with abdominal and lower extremity swelling.  He has also had  dyspnea on exertion, decreased energy capacity, intermittent episodes of  chest discomfort.  He presents for cardiac catheterization.   TECHNIQUE:  Informed consent was obtained.  The patient was brought to the  catheterization laboratory.  A 6-French arterial and 8-French venous sheath  were placed in the right femoral artery using modified Seldinger technique.  A 7.5-French PA catheter was advanced in the pulmonary artery and  appropriate right-sided hemodynamics were obtained.  Attempts to pass  catheters to the right groin proved unsuccessful and JR4 catheter positioned  at the site  of impasse showed occlusion of the right common iliac artery at  its origin.  The left groin was anesthetized and a 6-French sheath placed in  the left femoral artery.  Left heart catheterization and selective  angiography were then performed using performed 6-French Judkins catheters  via the left femoral artery.   FINDINGS:  RIGHT HEART CATHETERIZATION:  R 16/15, mean 14, RV 36/12, PA 32/17, pulmonary capillary wedge pressure  23/22, mean 20, cardiac output 3.9 L/minute with cardiac index of 1.8  L/minute/sq m by Fick method.   LEFT HEART CATHETERIZATION:  1. Left main trunk:  Small caliber vessel with mild irregularities.  2. LAD is medium caliber vessel that provides a major first diagonal branch     in the proximal segment.  The LAD has mild diffuse disease of 30%.  The     diagonal branch has moderate narrowing of 60-70% in the proximal segment.  3. Left circumflex artery is a medium caliber vessel.  Provides a large     first marginal branch in the mid section,  several small marginal branches     distally.  The AV circumflex has moderate disease of 40% near the takeoff     of the first marginal branch.  Remainder of the vessel has mild diffuse     disease of 30%.  4. Ramus intermedius is a small caliber vessel that provides the     anterolateral wall, bifurcates in its mid section.  There is a moderate     narrowing of 60% in the proximal segment.  5. Right coronary artery is a small caliber vessel.  Provides a small     posterior descending artery, posterior ventricular branch in terminal     segment.  The right coronary artery has severe segmental disease in     proximal and mid section of 95%.  There is then diffuse disease of 30% in     the mid and distal sections with narrowing of 40-50% in the PDA.  6. LV:  Normal end-systolic and end-diastolic dimensions.  Overall left     ventricular function is well preserved.  Ejection fraction greater than     55%.  No mitral  regurgitation.  LV pressure is 137/13.  Aortic is 135/64.     LVEDP equals 22.  7. Abdominal aorta is of normal caliber.  There is mild atheromatous disease     without critical stenosis or dilatation.  The renal arteries are single     and patent bilaterally with mild disease of 30%.  The left iliac artery     has moderate disease with plaque in the mid section of unclear     significance.  The right iliac artery is occluded at its origin.  It     reconstitutes after a short segment occlusion via collaterals.   With these findings we would elected to proceed with percutaneous  intervention to the right coronary artery.  The patient had been pre treated  with aspirin and Plavix and he was given heparin to maintain ACT of greater  than 250 seconds.  A 6-French JR4 guide catheter with side holes was used to  engage the right coronary artery and a 0.014 inch ______ wire advanced  medially.  This was used to size the lesion length and vessel diameter and a  2.5 x 15 mm Quantum Maverick balloon was used to pre dilate the lesion.  A  total of four inflations were performed, three at 6 atmospheres for 20  seconds, single inflation in the proximal segment at 8 atmospheres for 20  seconds.  Repeat angiography showed an excellent result, significant  improvement in vessel lumen.  A 2.75 x 33 mm Cypher stent was positioned in  the proximal right coronary artery extending into the mid section prior to  the RV marginal branch and deployed at 8 atmospheres for 30 seconds.  A 2.5  x 15 mm Quantum Maverick balloon was then used to post dilate the stent.  A  total of three inflations were performed at 14 atmospheres for 30 seconds  each.  Repeat angiography after the administration of intracoronary  nitroglycerin showed an excellent result with no residual stenosis, full  coverage of the lesion, and TIMI 3 flow to the right coronary artery.  There is no distal vessel.  This deemed an acceptable result, the  guide catheter  was then removed and the sheath secured in position.  The patient tolerated  procedure well and was transferred to the floor in stable condition.   FINAL RESULT:  Successful PCA  and stent placement to the proximal and mid  right coronary artery with reduction of 95% narrowing to 0% with placement  of a 2.75 x 33 mm Cypher stent dilated to 2.5 mm.   ASSESSMENT/PLAN:  Mr. Cutting is a 60 year old gentleman with moderate  coronary artery disease who has undergone percutaneous intervention of the  right coronary artery for improvement of right-sided heart failure.  The  patient will be continued on Plavix for a minimum of six months' time.  He  will be counseled to stop smoking.  In addition, he has occlusion of the  right iliac artery and has intermittent symptoms of claudication.  Consideration will be given towards percutaneous intervention to the right  iliac artery in the near future.                                               Veneda Melter, M.D.    Melton Alar  D:  05/05/2003  T:  05/05/2003  Job:  [id number]   cc:   Katrinka Blazing, M.D.  [facility]

## 2011-01-18 NOTE — Discharge Summary (Signed)
Carlos [name], Carlos [name]              ACCOUNT NO.:  [id number]   MEDICAL RECORD NO.:  [id number]          PATIENT TYPE:  INP   LOCATION:  3743                         FACILITY:  [facility]   PHYSICIAN:  Antonietta Breach, M.D.  DATE OF BIRTH:  10-Dec-1950   DATE OF ADMISSION:  04/21/2006  DATE OF DISCHARGE:                                 DISCHARGE SUMMARY   ADDENDUM:  At 13, the patient's wife called the nursing station asking to  speak to the undersigned.  The undersigned then went to the patient and  asked him if he wanted to give permission for the undersigned to speak to  the wife.  The patient stated that he did not want the undersigned to speak  to the wife and that he would not sign a release of information to do so.      Antonietta Breach, M.D.  Electronically Signed     JW/MEDQ  D:  04/25/2006  T:  04/25/2006  Job:  [id number]

## 2011-11-25 ENCOUNTER — Encounter: Payer: Self-pay | Admitting: Gastroenterology

## 2012-03-26 ENCOUNTER — Emergency Department: Payer: Self-pay | Admitting: *Deleted

## 2012-03-26 LAB — COMPREHENSIVE METABOLIC PANEL
Alkaline Phosphatase: 101 U/L (ref 50–136)
Anion Gap: 6 — ABNORMAL LOW (ref 7–16)
Bilirubin,Total: 0.6 mg/dL (ref 0.2–1.0)
Calcium, Total: 8.8 mg/dL (ref 8.5–10.1)
Chloride: 107 mmol/L (ref 98–107)
Co2: 27 mmol/L (ref 21–32)
Creatinine: 1.47 mg/dL — ABNORMAL HIGH (ref 0.60–1.30)
EGFR (Non-African Amer.): 50 — ABNORMAL LOW
Osmolality: 279 (ref 275–301)
Potassium: 3.7 mmol/L (ref 3.5–5.1)
Sodium: 140 mmol/L (ref 136–145)

## 2012-03-26 LAB — URINALYSIS, COMPLETE
Bacteria: NONE SEEN
Blood: NEGATIVE
Ketone: NEGATIVE
Leukocyte Esterase: NEGATIVE
Ph: 6 (ref 4.5–8.0)
RBC,UR: NONE SEEN /HPF (ref 0–5)
Specific Gravity: 1.015 (ref 1.003–1.030)
Squamous Epithelial: <1

## 2012-03-26 LAB — CBC
MCV: 86 fL (ref 80–100)
Platelet: 213 10*3/uL (ref 150–440)
RDW: 13.7 % (ref 11.5–14.5)
WBC: 10.9 10*3/uL — ABNORMAL HIGH (ref 3.8–10.6)

## 2012-04-22 ENCOUNTER — Emergency Department: Payer: Self-pay | Admitting: Internal Medicine

## 2014-07-18 IMAGING — CR DG CHEST 2V
1 series · 2 of 2 positions shown · non-contrast
Comparison: none

REASON FOR EXAM: weakness
COMMENTS:   May transport without cardiac monitor

[Series 1: w chest pa · 0.14mm/px · 2 of 2 slices shown]
[im 1/2]
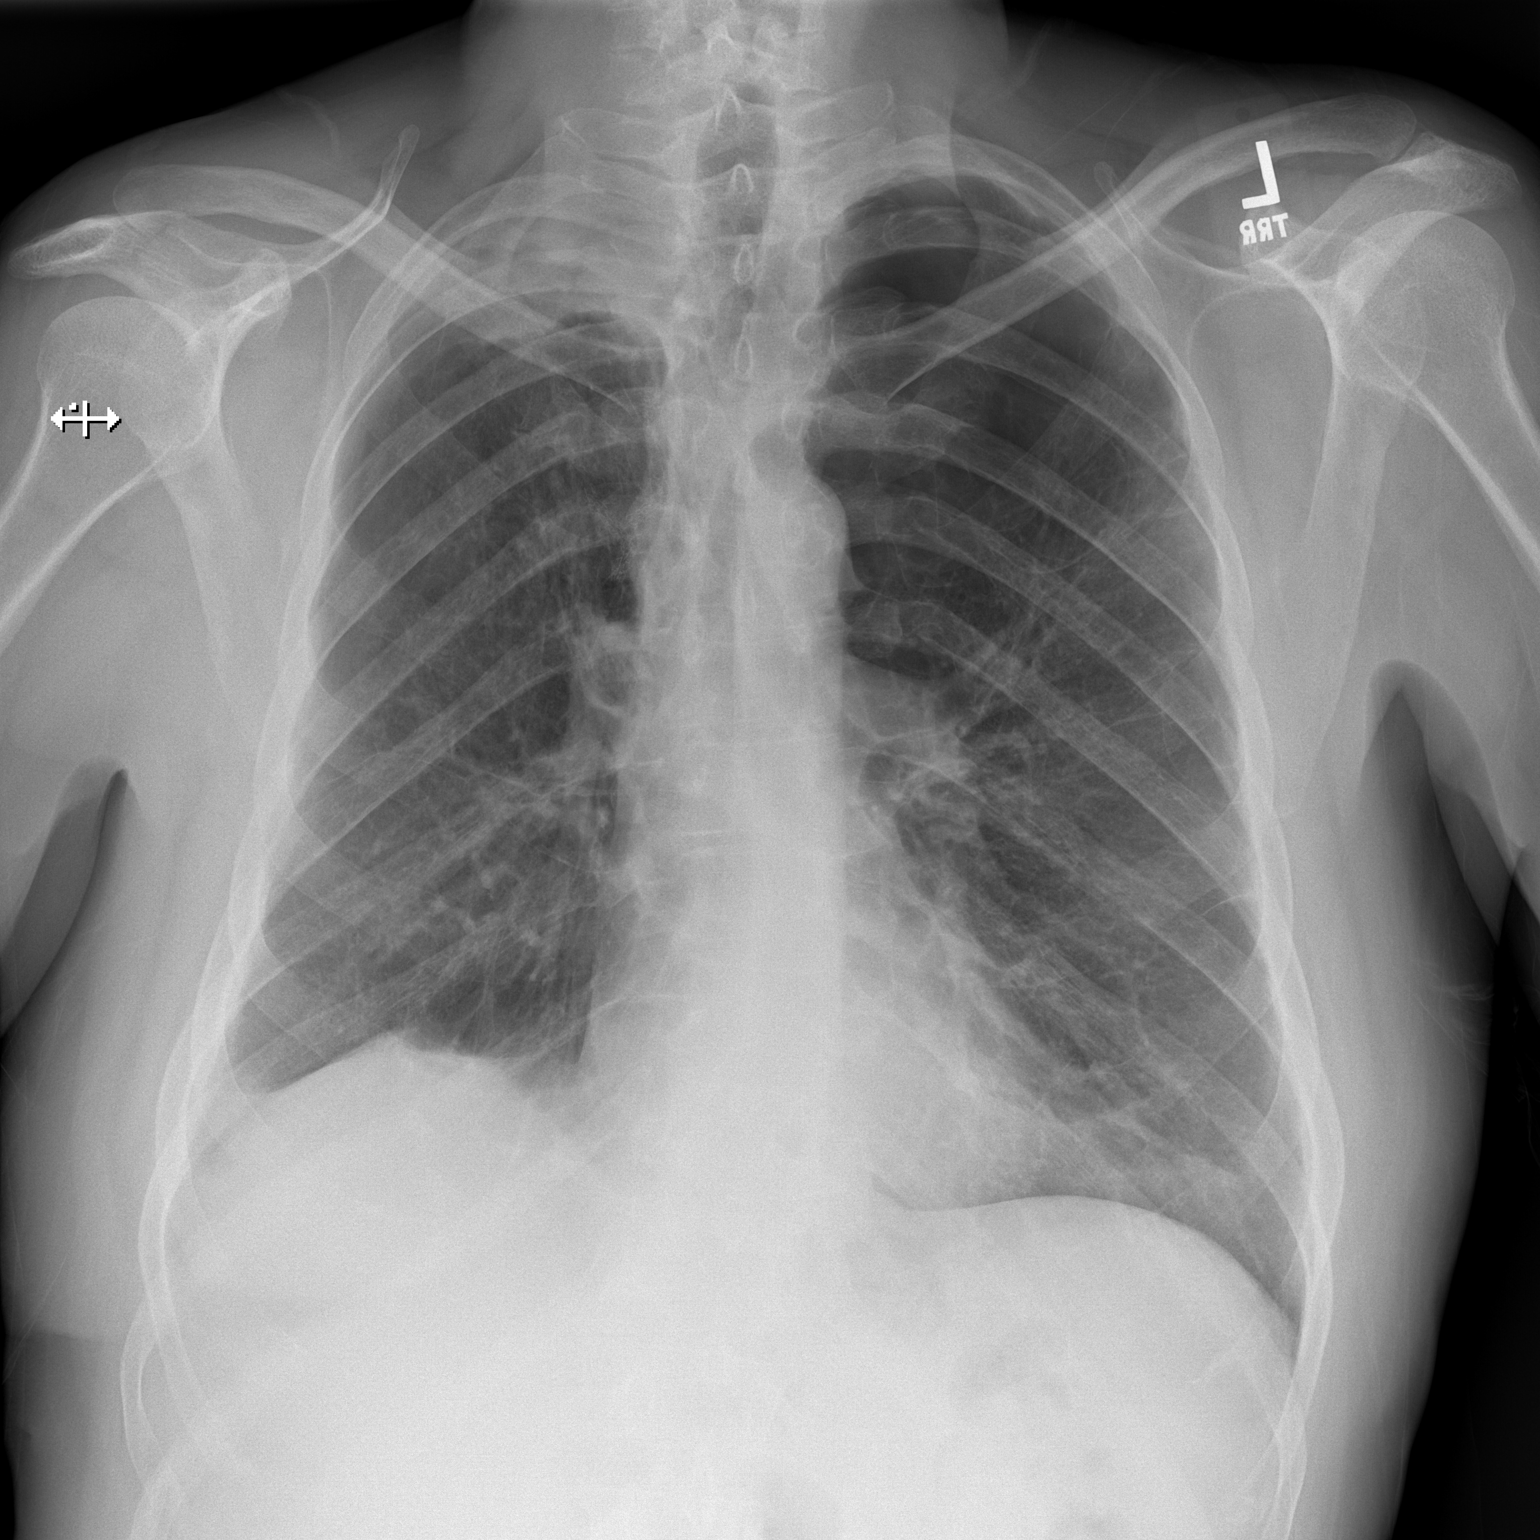
[im 2/2]
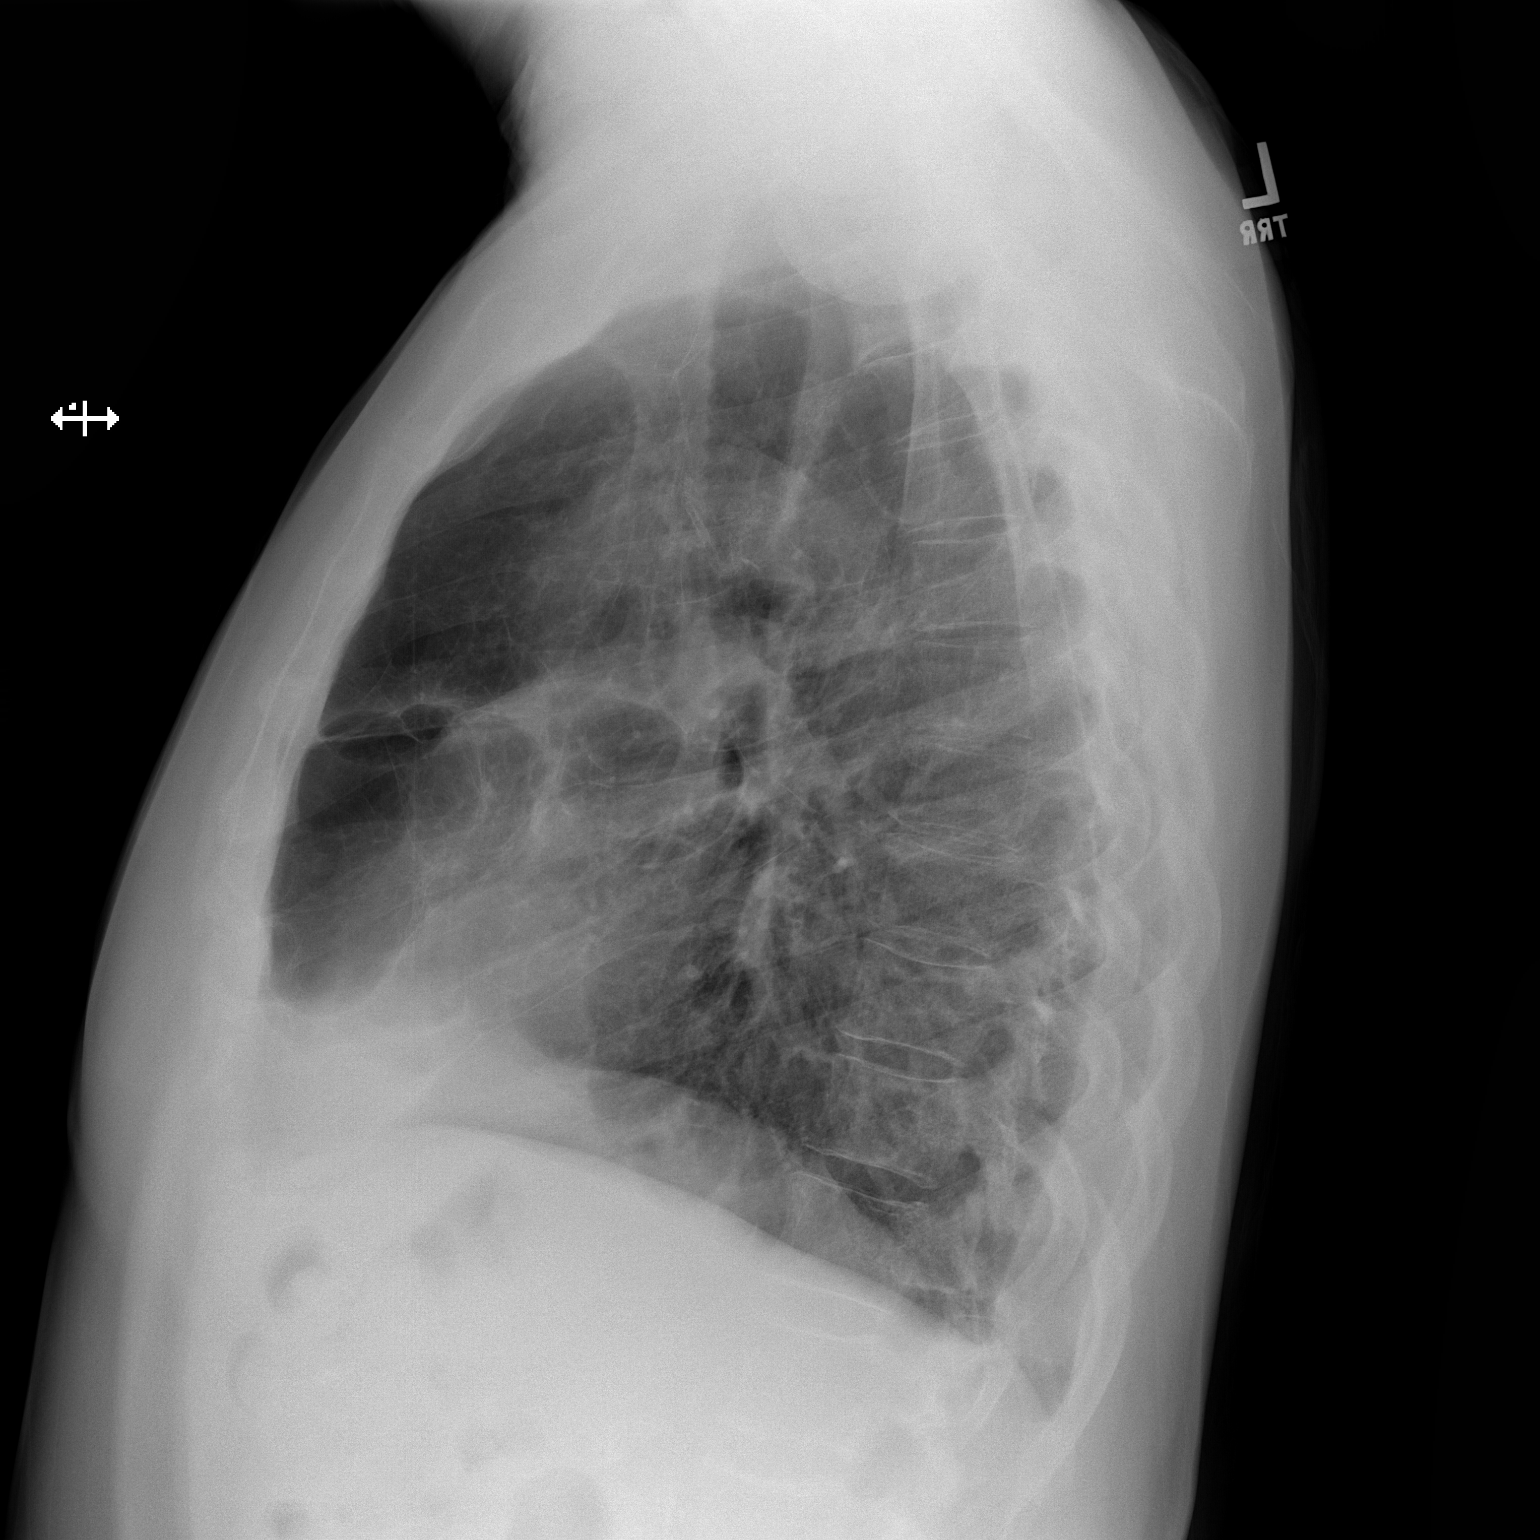

[2 of 2 positions shown; findings below may reference images not displayed]

PROCEDURE:     DXR - DXR CHEST PA (OR AP) AND LATERAL  - March 26, 2012  [DATE]

RESULT:     The lung markings are coarse. The cardiac silhouette is normal.
There is no effusion or pneumothorax. No lobar pneumonia is seen. There
appear to be lack of lung markings at the left lung apex consistent with a
bullous region. There is density at the right lung apex which could
represent fibrosis or mass. CT followup may be beneficial given there are
noted previous radiographs for comparison. There is a report from a CT from
4994 which mentions scarring and pleural thickening in the lung apices.
Those images are not available in the PACS system.
IMPRESSION: 1. Bullous emphysematous lung disease.
2. Underlying COPD.
3. Probable fibrotic changes especially in the right lung apex where there
is asymmetric increased density.

[REDACTED]

## 2014-12-20 NOTE — Consult Note (Signed)
PATIENT NAME:  Carlos [name], Carlos [name] MR#:  [id number] DATE OF BIRTH:  [birth year]  DATE OF CONSULTATION:  03/26/2012  REFERRING PHYSICIAN:   CONSULTING PHYSICIAN:  Quentin Ore III, MD  PRIMARY CARE PHYSICIAN: None.   CHIEF COMPLAINT: Rectal and coccyx pain.   BRIEF HISTORY: Deshannon Hinchliffe is a 64 year old gentleman seen in the Emergency Room with a several day history of pain around his rectum and lower spine. He has history of multiple previous pilonidal infections. Infections date back to his early 20's. He has had multiple abscesses drained spontaneously and only one requiring surgical drainage. He has never had a procedure to remove the cyst. He began to have symptoms several days ago which increased over the last 24 hours. He thought he was having a fever today and felt that his "body was hot". These findings prompted evaluation in the Emergency Room. Work-up in the Emergency Room revealed normal serum chemistries except for a slightly elevated creatinine of 1.47. White blood cell count was 10,900. His wife said he's had some pulmonary congestion recently but his troponins were negative in the Emergency Room. Because of his recurrent symptoms, the surgical service was consulted.   His history is significant for coronary artery disease having undergone stent placement in 2003 following a failed stress test. He did not have a myocardial infarction. He has been on Plavix since that time. He has had a history of multiple small strokes, currently under Plavix therapy. He has history of lung surgery for cancer, although the pathology appears to be in question. He has history of hypertension, chronic obstructive lung disease, and hyperlipidemia. Only previous surgery has been the lung surgery and the stent placements.   ALLERGIES: He is allergic to penicillin.   CURRENT MEDICATIONS:  1. Clonazepam 1 mg p.o. daily.  2. Cymbalta 30 mg p.o. b.i.d.  3. Furosemide 20 mg p.o. daily.  4. Gabapentin 400 mg p.o.  daily.  5. Levothyroxine 75 mcg p.o. daily.  6. Lisinopril 10 mg p.o. daily.  7. Plavix 75 mg p.o. daily.  8. Ranitidine 150 mg p.o. b.i.d.  9. Topiramate 50 mg b.i.d.  10. Trazodone 100 mg 1 to 2 tablets every day. 11. Vitamin B12 1000 mcg p.o. daily.   SOCIAL HISTORY: He continues to smoke. Drinks alcohol rarely. Is on disability. He recently returned to [place] after 2 to 3 years of living in [place] and has not established care with a local physician here. He has seen the [facility] medical group one time to get his medications refilled.   PHYSICAL EXAMINATION:   GENERAL: He is a pleasant gentleman in moderate distress from pain.   VITALS: Blood pressure 122/64, heart rate 68 and regular. He is afebrile. He relates his pain is a 10 out of 10. He does not appear to be that uncomfortable.   HEENT: Unremarkable with no scleral icterus. Normal equally round pupils. No facial deformities.   NECK: Supple without adenopathy. His trachea is midline.   CHEST: Clear with no axillary adenopathy and no adventitious sounds. Normal pulmonary excursion.   CARDIAC: No murmurs or gallops to my ear but he has very distant heart sounds. He appears to be in normal sinus rhythm.   ABDOMEN: His abdomen is benign with no significant organomegaly, mass, hernia, rebound, guarding, or abnormality in his bowel sounds.   RECTAL: Rectal exam reveals normal appearing rectum without evidence for any external lesions. Digital exam was not performed. Some erythema in the pilonidal cyst area with no evidence of  any fluctuance. There does not appear to be an active abscess that can be drained at this point.   PSYCHIATRIC: Normal orientation, normal affect.   IMPRESSION: This gentleman appears to have a pilonidal cyst infection without evidence of an abscess at the present time. With his Plavix drainage could be problematic and so we will set him up for an outpatient evaluation. We will start him on antibiotics  using Cleocin 300 mg p.o. q.8 hours, add pain medication, and see him back in the office in several days.   This plan has been discussed with the patient and his wife and they are in agreement.   ____________________________ Carmie Endalph L. Ely III, MD rle:drc D: 03/26/2012 22:01:12 ET T: 03/27/2012 10:48:29 ET JOB#: [id number]  cc: Carmie Endalph L. Ely III, MD, <Dictator> [facility] Quentin OreALPH L ELY MD ELECTRONICALLY SIGNED 04/03/2012 18:41

## 2021-07-03 DEATH — deceased
